# Patient Record
Sex: Female | Born: 1985 | Race: White | Hispanic: No | Smoking: Former smoker
Health system: Southern US, Community
[De-identification: ages and names within clinical notes are randomized; demographics above are authoritative.]

## PROBLEM LIST (undated history)

## (undated) VITALS — BP 100/67 | HR 69 | Resp 18

## (undated) DIAGNOSIS — N200 Calculus of kidney: Secondary | ICD-10-CM

## (undated) DIAGNOSIS — S2220XA Unspecified fracture of sternum, initial encounter for closed fracture: Secondary | ICD-10-CM

## (undated) DIAGNOSIS — D4959 Neoplasm of unspecified behavior of other genitourinary organ: Secondary | ICD-10-CM

## (undated) DIAGNOSIS — D260 Other benign neoplasm of cervix uteri: Secondary | ICD-10-CM

## (undated) DIAGNOSIS — J449 Chronic obstructive pulmonary disease, unspecified: Secondary | ICD-10-CM

## (undated) DIAGNOSIS — I471 Supraventricular tachycardia, unspecified: Secondary | ICD-10-CM

## (undated) DIAGNOSIS — G43909 Migraine, unspecified, not intractable, without status migrainosus: Secondary | ICD-10-CM

## (undated) DIAGNOSIS — J45909 Unspecified asthma, uncomplicated: Secondary | ICD-10-CM

## (undated) HISTORY — PX: ABDOMINAL HYSTERECTOMY: SHX81

## (undated) HISTORY — PX: APPENDECTOMY: SHX54

## (undated) HISTORY — PX: OTHER SURGICAL HISTORY: SHX169

## (undated) HISTORY — PX: MINOR HEMORRHOIDECTOMY: SHX6238

## (undated) HISTORY — PX: TUMOR REMOVAL: SHX12

## (undated) HISTORY — PX: BLADDER REPAIR: SHX76

---

## 2011-10-30 ENCOUNTER — Ambulatory Visit: Payer: Self-pay | Admitting: Internal Medicine

## 2012-01-17 ENCOUNTER — Emergency Department: Payer: Self-pay | Admitting: Emergency Medicine

## 2012-03-22 ENCOUNTER — Emergency Department: Payer: Self-pay | Admitting: Emergency Medicine

## 2012-03-23 ENCOUNTER — Emergency Department: Payer: Self-pay | Admitting: Emergency Medicine

## 2012-05-10 ENCOUNTER — Ambulatory Visit: Payer: Self-pay | Admitting: Surgery

## 2012-05-13 LAB — PATHOLOGY REPORT

## 2012-06-09 ENCOUNTER — Other Ambulatory Visit: Payer: Self-pay | Admitting: Surgery

## 2012-06-16 ENCOUNTER — Emergency Department: Payer: Self-pay | Admitting: Emergency Medicine

## 2012-06-16 LAB — URINALYSIS, COMPLETE
Glucose,UR: NEGATIVE mg/dL (ref 0–75)
Ketone: NEGATIVE
Nitrite: POSITIVE
Ph: 6 (ref 4.5–8.0)
RBC,UR: 596 /HPF (ref 0–5)
Specific Gravity: 1.009 (ref 1.003–1.030)
Squamous Epithelial: 2
WBC UR: 235 /HPF (ref 0–5)

## 2012-06-16 LAB — BASIC METABOLIC PANEL
Anion Gap: 4 — ABNORMAL LOW (ref 7–16)
BUN: 11 mg/dL (ref 7–18)
Calcium, Total: 9.5 mg/dL (ref 8.5–10.1)
Chloride: 106 mmol/L (ref 98–107)
Co2: 28 mmol/L (ref 21–32)
EGFR (African American): >60
Osmolality: 274 (ref 275–301)
Potassium: 3.9 mmol/L (ref 3.5–5.1)
Sodium: 138 mmol/L (ref 136–145)

## 2012-06-16 LAB — CBC
HCT: 43.6 % (ref 35.0–47.0)
MCHC: 32.1 g/dL (ref 32.0–36.0)
MCV: 95 fL (ref 80–100)
WBC: 5.9 10*3/uL (ref 3.6–11.0)

## 2012-06-16 LAB — HEPATIC FUNCTION PANEL A (ARMC)
Albumin: 4.5 g/dL (ref 3.4–5.0)
Alkaline Phosphatase: 61 U/L (ref 50–136)
Bilirubin,Total: 0.5 mg/dL (ref 0.2–1.0)
SGPT (ALT): 21 U/L (ref 12–78)
Total Protein: 8.6 g/dL — ABNORMAL HIGH (ref 6.4–8.2)

## 2012-06-16 LAB — WET PREP, GENITAL

## 2012-06-17 LAB — CBC
HCT: 40.6 % (ref 35.0–47.0)
HGB: 13.8 g/dL (ref 12.0–16.0)
MCV: 94 fL (ref 80–100)
Platelet: 241 10*3/uL (ref 150–440)
RDW: 12.8 % (ref 11.5–14.5)

## 2012-06-17 LAB — COMPREHENSIVE METABOLIC PANEL
Albumin: 4.1 g/dL (ref 3.4–5.0)
Anion Gap: 7 (ref 7–16)
Chloride: 110 mmol/L — ABNORMAL HIGH (ref 98–107)
Creatinine: 0.65 mg/dL (ref 0.60–1.30)
EGFR (African American): >60
EGFR (Non-African Amer.): >60
Glucose: 81 mg/dL (ref 65–99)
Osmolality: 281 (ref 275–301)
SGPT (ALT): 20 U/L (ref 12–78)
Sodium: 142 mmol/L (ref 136–145)
Total Protein: 7.8 g/dL (ref 6.4–8.2)

## 2012-06-18 ENCOUNTER — Inpatient Hospital Stay: Payer: Self-pay | Admitting: Surgery

## 2012-06-18 LAB — CBC WITH DIFFERENTIAL/PLATELET
Basophil #: 0 10*3/uL (ref 0.0–0.1)
Basophil %: 0.8 %
Eosinophil #: 0.1 10*3/uL (ref 0.0–0.7)
Eosinophil %: 2.4 %
HCT: 36.6 % (ref 35.0–47.0)
Lymphocyte %: 37.6 %
Neutrophil #: 2.2 10*3/uL (ref 1.4–6.5)
Platelet: 225 10*3/uL (ref 150–440)
RBC: 3.85 10*6/uL (ref 3.80–5.20)
WBC: 4.4 10*3/uL (ref 3.6–11.0)

## 2012-06-21 LAB — PATHOLOGY REPORT

## 2013-01-20 ENCOUNTER — Emergency Department: Payer: Self-pay | Admitting: Emergency Medicine

## 2013-03-09 ENCOUNTER — Ambulatory Visit: Payer: Self-pay | Admitting: Obstetrics and Gynecology

## 2013-03-17 ENCOUNTER — Ambulatory Visit: Payer: Self-pay | Admitting: Specialist

## 2013-04-06 ENCOUNTER — Emergency Department: Payer: Self-pay | Admitting: Emergency Medicine

## 2013-04-20 ENCOUNTER — Ambulatory Visit: Payer: Self-pay | Admitting: Obstetrics and Gynecology

## 2013-04-20 LAB — BASIC METABOLIC PANEL
Anion Gap: 6 — ABNORMAL LOW (ref 7–16)
Calcium, Total: 8.8 mg/dL (ref 8.5–10.1)
Chloride: 108 mmol/L — ABNORMAL HIGH (ref 98–107)
EGFR (African American): >60
EGFR (Non-African Amer.): >60
Glucose: 79 mg/dL (ref 65–99)
Osmolality: 275 (ref 275–301)
Potassium: 3.9 mmol/L (ref 3.5–5.1)
Sodium: 139 mmol/L (ref 136–145)

## 2013-04-20 LAB — CBC
HCT: 38.8 % (ref 35.0–47.0)
MCHC: 33.9 g/dL (ref 32.0–36.0)
MCV: 94 fL (ref 80–100)
Platelet: 252 10*3/uL (ref 150–440)
RBC: 4.11 10*6/uL (ref 3.80–5.20)

## 2013-04-20 LAB — PREGNANCY, URINE: Pregnancy Test, Urine: NEGATIVE m[IU]/mL

## 2013-04-24 ENCOUNTER — Inpatient Hospital Stay: Payer: Self-pay | Admitting: Obstetrics and Gynecology

## 2013-04-25 LAB — CREATININE, SERUM
Creatinine: 0.62 mg/dL (ref 0.60–1.30)
EGFR (African American): >60

## 2013-04-25 LAB — HEMOGLOBIN: HGB: 8.8 g/dL — ABNORMAL LOW (ref 12.0–16.0)

## 2013-04-26 LAB — BASIC METABOLIC PANEL
Anion Gap: 3 — ABNORMAL LOW (ref 7–16)
BUN: 5 mg/dL — ABNORMAL LOW (ref 7–18)
Chloride: 108 mmol/L — ABNORMAL HIGH (ref 98–107)
Co2: 29 mmol/L (ref 21–32)
Creatinine: 0.66 mg/dL (ref 0.60–1.30)
EGFR (Non-African Amer.): >60
Glucose: 118 mg/dL — ABNORMAL HIGH (ref 65–99)
Osmolality: 278 (ref 275–301)
Sodium: 140 mmol/L (ref 136–145)

## 2013-04-26 LAB — CBC WITH DIFFERENTIAL/PLATELET
Basophil %: 0.3 %
Eosinophil #: 0 10*3/uL (ref 0.0–0.7)
Eosinophil %: 0.1 %
HCT: 21.9 % — ABNORMAL LOW (ref 35.0–47.0)
HGB: 7.6 g/dL — ABNORMAL LOW (ref 12.0–16.0)
Lymphocyte #: 0.7 10*3/uL — ABNORMAL LOW (ref 1.0–3.6)
Lymphocyte %: 9.2 %
MCH: 32.7 pg (ref 26.0–34.0)
Platelet: 150 10*3/uL (ref 150–440)

## 2013-04-26 LAB — PATHOLOGY REPORT

## 2013-05-08 ENCOUNTER — Ambulatory Visit: Payer: Self-pay | Admitting: Urology

## 2013-05-09 ENCOUNTER — Other Ambulatory Visit: Payer: Self-pay | Admitting: Obstetrics and Gynecology

## 2013-05-09 LAB — CBC WITH DIFFERENTIAL/PLATELET
Basophil #: 0.1 10*3/uL (ref 0.0–0.1)
Basophil %: 0.9 %
Eosinophil #: 0.2 10*3/uL (ref 0.0–0.7)
HCT: 33.5 % — ABNORMAL LOW (ref 35.0–47.0)
HGB: 11.3 g/dL — ABNORMAL LOW (ref 12.0–16.0)
Lymphocyte %: 16.8 %
MCV: 92 fL (ref 80–100)
Monocyte %: 5.3 %
Neutrophil %: 74.4 %

## 2013-06-14 ENCOUNTER — Ambulatory Visit: Payer: Self-pay | Admitting: Urology

## 2013-09-25 ENCOUNTER — Ambulatory Visit: Payer: Self-pay | Admitting: Specialist

## 2013-09-28 LAB — PATHOLOGY REPORT

## 2014-02-10 ENCOUNTER — Ambulatory Visit: Payer: Self-pay | Admitting: Family Medicine

## 2014-02-10 LAB — COMPREHENSIVE METABOLIC PANEL
ALK PHOS: 48 U/L
ALT: 18 U/L
AST: 14 U/L — AB (ref 15–37)
Albumin: 3.8 g/dL (ref 3.4–5.0)
Anion Gap: 9 (ref 7–16)
BILIRUBIN TOTAL: 0.5 mg/dL (ref 0.2–1.0)
BUN: 11 mg/dL (ref 7–18)
CALCIUM: 9.2 mg/dL (ref 8.5–10.1)
CO2: 24 mmol/L (ref 21–32)
Chloride: 105 mmol/L (ref 98–107)
Creatinine: 0.73 mg/dL (ref 0.60–1.30)
EGFR (Non-African Amer.): >60
GLUCOSE: 91 mg/dL (ref 65–99)
OSMOLALITY: 275 (ref 275–301)
POTASSIUM: 3.5 mmol/L (ref 3.5–5.1)
Sodium: 138 mmol/L (ref 136–145)
TOTAL PROTEIN: 7.4 g/dL (ref 6.4–8.2)

## 2014-02-10 LAB — URINALYSIS, COMPLETE
BILIRUBIN, UR: NEGATIVE
Bacteria: NEGATIVE
Blood: NEGATIVE
GLUCOSE, UR: NEGATIVE
KETONE: NEGATIVE
Leukocyte Esterase: NEGATIVE
NITRITE: NEGATIVE
PH: 5.5 (ref 5.0–8.0)
Protein: NEGATIVE
RBC,UR: NONE SEEN /HPF (ref 0–5)
Specific Gravity: 1.025 (ref 1.000–1.030)
WBC UR: NONE SEEN /HPF (ref 0–5)

## 2014-02-10 LAB — CBC WITH DIFFERENTIAL/PLATELET
Basophil #: 0 10*3/uL (ref 0.0–0.1)
Basophil %: 0.5 %
EOS ABS: 0.1 10*3/uL (ref 0.0–0.7)
EOS PCT: 1.4 %
HCT: 41 % (ref 35.0–47.0)
HGB: 13.9 g/dL (ref 12.0–16.0)
Lymphocyte #: 1 10*3/uL (ref 1.0–3.6)
Lymphocyte %: 25.9 %
MCH: 31.4 pg (ref 26.0–34.0)
MCHC: 33.9 g/dL (ref 32.0–36.0)
MCV: 93 fL (ref 80–100)
MONO ABS: 0.4 x10 3/mm (ref 0.2–0.9)
MONOS PCT: 10.8 %
Neutrophil #: 2.5 10*3/uL (ref 1.4–6.5)
Neutrophil %: 61.4 %
Platelet: 253 10*3/uL (ref 150–440)
RBC: 4.43 10*6/uL (ref 3.80–5.20)
RDW: 12.4 % (ref 11.5–14.5)
WBC: 4 10*3/uL (ref 3.6–11.0)

## 2014-02-12 LAB — URINE CULTURE

## 2014-02-15 ENCOUNTER — Ambulatory Visit: Payer: Self-pay | Admitting: Family Medicine

## 2014-02-22 ENCOUNTER — Ambulatory Visit: Payer: Self-pay | Admitting: Neurology

## 2014-03-08 ENCOUNTER — Other Ambulatory Visit: Payer: Self-pay | Admitting: Specialist

## 2014-03-08 ENCOUNTER — Ambulatory Visit: Payer: Self-pay | Admitting: Specialist

## 2014-03-08 LAB — D-DIMER(ARMC): D-DIMER: 891 ng/mL

## 2014-08-01 ENCOUNTER — Emergency Department: Payer: Self-pay | Admitting: Emergency Medicine

## 2014-09-25 NOTE — Consult Note (Signed)
PATIENT NAME:  Maria Silva, Maria Silva MR#:  [id number] DATE OF BIRTH:  1985/11/14  DATE OF CONSULTATION:  03/23/2012  REFERRING PHYSICIAN:   CONSULTING PHYSICIAN:  Harrell Gave A. Inaara Tye, MD  CHIEF COMPLAINT: Rectal pain times two weeks.   HISTORY OF PRESENT ILLNESS: Ms. Maria Silva is a pleasant 29 year old female with history of hemorrhoids that she likely developed after multiple children and which required prior incision for thrombosis. She says that her most recent episode began over the last two weeks. She says that she has an area that is tender. Normally she after having them lysed gets better. However, she had her thrombosed hemorrhoid lysed yesterday and they did not improve. She said that initially she felt better, but the pain recurred. She has been having normal bowel movement, does not strain with  bowel movements. Otherwise no fevers, chills, night sweats, shortness of breath, headaches, chest pain, abdominal pain, nausea, vomiting, diarrhea, constipation, hematuria, or dysuria.   PAST MEDICAL HISTORY:  1. Hemorrhoids.  2. History of C-section.  3. History of tubal ligation.  4. History of supraventricular tachycardia.   CURRENT MEDICATIONS: 1. Percocet 1 tab p.o. q. 6 h. p.r.n. pain. 2. Colace 100, 1 tab p.o. b.i.d.   ALLERGIES: Oxacillin.   FAMILY HISTORY: Noncontributory.   PHYSICAL EXAMINATION:  VITAL SIGNS: Temperature 98.1, pulse 48, blood pressure 114/74, respirations 20, 99 percent on room air.   GENERAL: No acute distress, alert and oriented times three.   HEAD: Normocephalic, atraumatic.   EYES: No jaundice. No scleral icterus.  NECK: No obvious masses.   RESPIRATORY:  Moving air well. No splinting.   HEART: Regular rate and rhythm.   ABDOMEN: Soft, nontender, nondistended.   RECTUM: Has what looks like a right posterior column external hemorrhoid which is thrombotic. It appears to have been lysed but still is very thrombotic. I have thus with local  anesthetic attempted to re-lance this and have opened the incision significantly but have not been able to express any clot.   EXTREMITIES: Moves all extremities well.   LABORATORY DATA:  No current labs.   ASSESSMENT AND PLAN: Ms. Maria Silva is a pleasant 29 year old female who had a previous lysis of thrombosed hemorrhoid.  I have attempted to see if there is any more clot there for expression and have opened it significantly but there is not much clot. I thus recommend stool softeners, sitz bath, lidocaine topically, and  pain control.  She will follow up with me in the office in two days.   Procedure Note:  After Maria Silva received oral pain medication and I had consented her for incision of her thrombosed hemorrohid, I prepped and draped her anus.  I then infiltrated her hemorrhoid with approx 4 ml of 1% lidocaine with epinephrine.I then incised her hemorrohoid.  There was minimal clot and bleeding.  I then placed sterile gauze over her hemorrhoid after I was ensured it was hemostatic.  There were no immediate complications.  ____________________________ Glena Norfolk Iziah Cates, MD cal:bjt D: 03/23/2012 15:35:47 ET T: 03/23/2012 16:30:05 ET JOB#: [id number]  cc: Harrell Gave A. Jesse Hirst, MD, <Dictator> Floyde Parkins MD ELECTRONICALLY SIGNED 03/27/2012 9:50

## 2014-09-25 NOTE — Op Note (Signed)
PATIENT NAME:  Maria Silva, Maria Silva MR#:  [id number] DATE OF BIRTH:  28-Mar-1986  DATE OF PROCEDURE:  05/10/2012  PREOPERATIVE DIAGNOSIS: Engorged, recurrently thrombosed external hemorrhoids.   POSTOPERATIVE DIAGNOSIS: Engorged, recurrently thrombosed external hemorrhoids.   PROCEDURES PERFORMED: 1. Rectal exam under anesthesia. 2. Closed excisional external hemorrhoidectomy x 2 columns.   SURGEON: Emilie Carp A. Valery Chance, MD   ANESTHESIA: General.   ESTIMATED BLOOD LOSS: 15 mL.   COMPLICATIONS: None.   SPECIMENS: Hemorrhoids.   INDICATION FOR SURGERY: Ms. Maria Silva is a pleasant 29 year old female who has had recurrent thrombosed external hemorrhoids which began after her multiple pregnancies. I first met her approximately two months ago with thrombosed external hemorrhoids. Those have gotten better but she still feels the sensation of having hemorrhoids and says this is very uncomfortable. She presented for surgical hemorrhoidectomy.   DETAILS OF PROCEDURE: Informed consent was obtained as an outpatient. She was brought to the operating room suite. She was induced, endotracheal tube was placed, and general anesthesia was administered. She was positioned in the prone jackknife position. Her buttocks were taped apart and Betadine was used to prep her anus. Sterile drapes were then placed in the operative field. I began by doing a digital exam and did not feel any masses. On visual inspection I did find two large engorged areas of external hemorrhoids which were protruding. I injected 40 mL of 1% lidocaine with epinephrine into her perianal skin and proximal to her sphincter muscles for pain control and to allow relaxation. Using a Hill-Ferguson retractor circumferentially, I found that the hemorrhoids appeared to be associated with the left lateral columns and the right posterior columns. She had a slight left lateral internal hemorrhoid. Using an Allis clamp I slighted the left lateral hemorrhoid  and placed hemostatic 3-0 chromic stitch at the apex. I then used a Harmonic to excise the hemorrhoid and then close the mucosal defect using the same running 3-0 chromic suture grabbing a piece of mucosa and underlying muscle to get rid of the dead space and then mucosa on the other side. I locked these in a running fashion. I then focused on the right posterior column. Again, I placed an 0 chromic suture to ligate the pedicle at the apex of the hemorrhoid and excised the hemorrhoid using a Harmonic scalpel. After ensuring it was hemostatic, I then closed the defect using the same running 3-0 chromic again taking a bite of the underlying muscle to allow the dead space to close the hematoma quicker. I then irrigated the area and it was hemostatic. I placed a fibrillar plug into her anus which she will expel with her next bowel movement. She was placed back on the transport bed, extubated and brought to the postanesthesia care unit. There were no immediate complications.   She was then discharged home in satisfactory condition to follow with me in approximately two weeks barring any complications.   ____________________________ Glena Norfolk Yizel Canby, MD cal:drc D: 05/10/2012 09:02:45 ET T: 05/10/2012 11:17:10 ET JOB#: [id number]  cc: Harrell Gave A. Adrienne Trombetta, MD, <Dictator> Floyde Parkins MD ELECTRONICALLY SIGNED 05/11/2012 20:40

## 2014-09-28 NOTE — Consult Note (Signed)
Pt with moderate discomfortUOP, slight blood tinged.good.answered from patient.post op care except maintain foley at least 7 days.  Electronic Signatures: Murrell Redden (MD)  (Signed on [phone] 09:18)  Authored  Last Updated: [id number]-RXY-[id number] 09:18 by Murrell Redden (MD)

## 2014-09-28 NOTE — Op Note (Signed)
PATIENT NAME:  Maria Silva, Maria Silva MR#:  [id number] DATE OF BIRTH:  04/14/1986  DATE OF PROCEDURE:  04/24/2013  PREOPERATIVE DIAGNOSIS: Gross hematuria.   POSTOPERATIVE DIAGNOSIS: Bladder laceration.   PROCEDURES:  1. Cystoscopy.  2. Closure of cystotomy.   SURGEON: Scott C. Bernardo Heater, M.D.   ASSISTANT: Dr. Enzo Bi.   ANESTHESIA: General.   INDICATION: A 29 year old female with chronic pelvic pain and endometriosis. She underwent a laparoscopic-assisted vaginal hysterectomy and bilateral salpingo-oophorectomy earlier today. Urine was clear in the operating room; however, she was noted in the postanesthetic care unit to have gross hematuria. She is taken back to the operating room for cystoscopy and possible exploratory laparotomy.   DESCRIPTION OF PROCEDURE: The patient was taken to the operating room where general anesthetic was administered. She was then placed in the low lithotomy position, and her external genitalia were prepped and draped in the usual fashion. A 21-French cystoscope sheath with obturator was lubricated and passed per urethra. A moderate amount of bloody urine with a few small clots returned. The 30-degree lens was then placed in the sheath. The ureteral orifices were normal appearing with clear efflux. On the left posterior wall, a bladder laceration was noted, estimated approximately 2 cm. Although the laceration was well away from the ureteral orifice, it was elected to place a 6-French open-ended ureteral catheter which was placed over a wire. A 22-French Foley was placed. The patient was then reprepped and draped. Laparotomy was performed by Dr. Enzo Bi which is dictated separately. After adequate exposure, approximately 2.5 cm posteroinferior wall bladder laceration was noted. The superior and inferior edges were exposed. The laceration was repaired with a running, interlocking 2-0 Vicryl suture. The bladder was instilled with 120 mL of saline with minimal leakage  noted. A second layer was performed with a running 2-0 Vicryl suture. Repeat instillation with saline showed no leakage. Foley catheter was kept to gravity drainage. Closure dictated separately by Dr. Enzo Bi. There were no complications.   ____________________________ [name], MD scs:gb D: 04/24/2013 21:34:53 ET T: 04/24/2013 22:11:48 ET JOB#: [id number]  cc: Nicki Reaper C. Bernardo Heater, MD, <Dictator> Abbie Sons MD ELECTRONICALLY SIGNED 05/14/2013 17:10

## 2014-09-28 NOTE — Op Note (Signed)
PATIENT NAME:  Maria Silva, Maria Silva MR#:  [id number] DATE OF BIRTH:  03/27/1986  DATE OF PROCEDURE:  04/24/2013  PREOPERATIVE DIAGNOSES:  1.  Chronic pelvic pain.  2.  Endometriosis.  3.  Family history of breast and ovarian cancer.   OPERATIVE PROCEDURE: Laparoscopically assisted vaginal hysterectomy, bilateral salpingo-oophorectomy.   SURGEON: Alanda Slim. Kyllie Pettijohn, MD  FIRST ASSISTANT: Dr. Marcelline Mates and Ferne Coe (?) PA-S.   ANESTHESIA: General endotracheal.   INDICATIONS: The patient is a 29 year old white female who has diagnosis of endometriosis who presents for definitive surgery due to chronic pelvic pain. Previously an endometrioma was excised from the right adnexa by general surgery during an appendectomy procedure.   FINDINGS AT SURGERY: A few white lesions of endometriosis noted in the cul-de-sac. There was a right ovarian cyst, simple. The tube on the left showed evidence of tubal ligation. The uterus was grossly normal.   DESCRIPTION OF PROCEDURE: The patient was brought to the operating room where she was placed in the supine position. General endotracheal anesthesia was induced without difficulty. She was placed in the dorsal lithotomy position using the Bumblebee stirrups. A ChloraPrep and Betadine abdominal, perineal, intravaginal prep and drape was performed in standard fashion. Foley catheter was placed and was draining clear yellow urine from the bladder.   The laparoscopic portion of the procedure was performed in routine fashion. Subumbilical vertical incision 5 mm in length was made. The Optiview laparoscopic trocar system was used to place a 5 mm port directly into the abdominopelvic cavity without evidence of bowel or vascular injury. The right and left lower quadrant ports were then placed under direct visualization. These were also 5 mm ports.   Using the Ace Harmonic scalpel, the round ligaments were clamped, coagulated and cut. The infundibulopelvic ligaments were  again isolated and clamped, coagulated and cut using the Harmonic scalpel. Care was taken to avoid the ureters. The cardinal-broad ligament complexes were taken down, again with the Harmonic scalpel bilaterally. Bladder flap was created using the Harmonic scalpel as well.   Once the bladder was mobilized, the vaginal portion of the procedure was then started. The patient was placed in the dorsal lithotomy position with legs being placed higher. The weighted speculum was placed into the vagina and a double-tooth tenaculum was placed onto the cervix. A posterior colpotomy was made with Mayo scissors. The uterosacral ligaments were clamped, cut, and stick-tied using 0 Vicryl suture. The posterior cuff was run using a running locking stitch of 0 chromic suture. A long weighted speculum was placed then back into the vagina.   The cervix was circumscribed using the Bovie cautery. The bladder was dissected off the lower uterine segment through sharp and blunt dissection. Sequentially the cardinal-broad ligament complexes were then clamped, cut, and stick-tied using 0 Vicryl suture. This was carried out until the specimen was completely mobilized and removed from the operative field. A moderate amount of bleeding was encountered and laparoscopy was then repeated to identify the source of bleeding. Once laparoscopy was implemented, there may have been what appeared to be a little bit of pedicle bleeding from the left IP ligament. Kleppinger bipolar cautery was used to further attain hemostasis. All pedicles appeared hemostatic and the decision was made then to proceed with vaginal closure. The vagina was then closed using simple interrupted sutures of 2-0 chromic suture. Upon completion of the procedure, the patient was then awakened, extubated and taken to the recovery room in satisfactory condition.   ESTIMATED BLOOD LOSS: 500 mL.  IV FLUIDS: 2 liters.   URINE OUTPUT: Not quantified at the time of this  dictation.    ____________________________ Alanda Slim Shenae Bonanno, MD mad:np D: 04/24/2013 19:33:19 ET T: 04/24/2013 19:59:32 ET JOB#: [id number]  cc: Hassell Done A. Fendi Meinhardt, MD, <Dictator> [facility] Alanda Slim Mckinleigh Schuchart MD ELECTRONICALLY SIGNED 04/25/2013 22:05

## 2014-09-28 NOTE — Discharge Summary (Signed)
PATIENT NAME:  Maria Silva, Maria Silva MR#:  [id number] DATE OF BIRTH:  March 04, 1986  DATE OF ADMISSION:  06/17/2012 DATE OF DISCHARGE:  06/21/2012  BRIEF HISTORY AND HOSPITAL COURSE: Maria Silva is a 29 year old woman seen in the Emergency Room with significant abdominal pain, referred by her primary care provider, Melody Burr. She had had significant abdominal pain for several days. She presented to the Emergency Room for further evaluation 24 hours prior to admission and she was noted to have a slightly enlarged appendix. She continued to have significant abdominal discomfort. She was referred to her gynecology service who recommended she return to the Emergency Room for further evaluation. Work-up in the Emergency Room did not reveal any evidence of significant abdominal symptoms suggestive of appendicitis. She did have normal white blood cell count at 5900 the night before and 3700 the day of admission. Clinical examination did not support appendicitis. We admitted her to the hospital, placed her on IV medications and pain medications and fluid rehydration. She continued to have significant abdominal discomfort not controlled by narcotics. On January 11th she was taken to surgery where she underwent diagnostic laparoscopy. The appendix was normal, but was removed. She appeared to have evidence of endometriosis in the pelvis and several pictures were taken to help confirm the finding. Pathology did demonstrate an endometrioma from her right tube and ovary. She improved over the next 48 hours on pain medication and rehydration slowly advancing her diet. She was then discharged home on the 14th to be followed in the gynecologist's office for further investigation and work-up and possible treatment for her endometriosis.   DISCHARGE MEDICATIONS: 1. Ventolin 90 mcg 2 puffs 4 times a day.  2. Alaway 0.025% ophthalmic solution once a day. 3. Percocet 5/325 every 6 hours p.r.n.  4. Xanax 0.25 mg p.o. q. 8 hours  p.r.n.  5. Morphine 30 mg extended-release 1 tablet every 8 hours. 6. Morphine 15 mg immediate-release every 3 hours p.r.n.  NOTE: She was to stop taking her Excedrin. No antibiotics were prescribed.   FINAL DISCHARGE DIAGNOSES: 1. Abdominal pain.  2. Endometriosis.   SURGERY: 1. Diagnostic laparoscopy.  2. Appendectomy.  3. Excision of endometrioma.  ____________________________ [name], MD rle:sb D: 06/27/2012 20:04:32 ET T: 06/28/2012 08:46:17 ET JOB#: [id number]  cc: Rodena Goldmann III, MD, <Dictator> Melody Valene Bors, CNM Rodena Goldmann MD ELECTRONICALLY SIGNED 06/28/2012 22:49

## 2014-09-28 NOTE — Op Note (Signed)
PATIENT NAME:  Maria Silva, Maria Silva DATE OF BIRTH:  May 24, 1986  DATE OF PROCEDURE:  06/18/2012  PREOPERATIVE DIAGNOSIS: Abdominal pain.  POSTOPERATIVE DIAGNOSIS: Abdominal pain. Possible endometriosis and endometrioma.   SURGERY: Laparoscopy and incidental appendectomy.   SURGEON: Rodena Goldmann, M.D.   ANESTHESIA: General.   OPERATIVE PROCEDURE: With the patient in the supine position after the induction of appropriate general anesthesia, the patient's abdomen was prepped with ChloraPrep and draped with sterile towels. The patient was placed in the head-down, feet-up position. A small infraumbilical incision was made in the standard fashion, carried down bluntly through the subcutaneous tissue. The Veress needle was used to cannulate the peritoneal cavity. CO2 was insufflated to the appropriate pressure measurements. When approximately 2.5 liters of CO2 were instilled, the Veress needle was withdrawn. An 11 mm [employer] port was inserted into the peritoneal cavity. Intraperitoneal position was confirmed. CO2 was re-insufflated. A right upper quadrant transverse incision made and an 11 mm port inserted under direct vision. The right lower quadrant was investigated. The appendix appeared to be normal without evidence of inflammatory change, injection or rupture. There was a large, normal-appearing right ovary with several small cystic structures on the surface. There was a circular hemorrhagic structure attached to the tube, which appeared to be a possible endometrioma.  It was removed.  It was approximately 1 cm to 1.5 cm in size. Pictures were taken. The specimen was sent for permanent section. The left ovary was examined. There appeared to be some endometriosis on the tube.  There were some cystic structures on the surface in addition, but they were quite small.  Uterus was examined and no significant abnormalities were identified.  The  abdomen was visually inspected.  There was no obvious  abnormality.  The camera was moved to the upper port and the umbilicus re-examined.  There did not appear to be any evidence of vascular or bowel injury on entering the abdomen.  The appendix was then removed using GI stapling device, dividing the base of the appendix with a single blue load and the mesoappendix was with 2 applications of the GI white load.  The appendix was removed with an Endo Catch apparatus.  The area was copiously suctioned and irrigated.  The abdomen was desufflated.  Midline fascia was closed with figure-of-eight sutures of 0 Vicryl.  The skin was closed with 5-0 nylon.  The area was infiltrated with 0.25% Marcaine for postoperative pain control.  Sterile dressings were applied.  The patient was returned to the recovery room having tolerated the procedure well. Sponge, instrument and needle counts were correct x 2 in the operating room.     ____________________________ [name], MD rle:cc D: 06/18/2012 16:48:00 ET T: 06/19/2012 19:21:04 ET JOB#: [id number]  cc: Rodena Goldmann III, MD, <Dictator>  Rodena Goldmann MD ELECTRONICALLY SIGNED 06/24/2012 8:48

## 2014-09-28 NOTE — Op Note (Signed)
PATIENT NAME:  Maria Silva, Maria Silva MR#:  [id number] DATE OF BIRTH:  07-08-1985  DATE OF PROCEDURE:  04/24/2013  PREOPERATIVE DIAGNOSES:  1.  Status post laparoscopic-assisted vaginal hysterectomy, bilateral salpingo-oophorectomy.  2.  Hematuria.   POSTOPERATIVE DIAGNOSES:  1.  Status post laparoscopic-assisted vaginal hysterectomy, bilateral salpingo-oophorectomy.  2.  Hematuria.  3.  Bladder lacerations.   OPERATIVE PROCEDURES:  1.  Cystoscopy (Dr. John Giovanni).  2.  Exploratory laparotomy with lateral laceration repair.   ANESTHESIA: General endotracheal.   SURGEONS: Brayton Mars, M.D. and Dr. John Giovanni.  ASSISTANT: Rubie Maid, MD, and Hazle Coca, PA-S.   INDICATIONS: The patient is a 29 year old white female who just completed an LAVH-BSO procedure for symptomatic endometriosis, who was noted in the recovery room to have gross hematuria. Because of concern for possible bladder injury, the patient was counseled and consented to have cystoscopy evaluation with possible bladder repair surgery.   FINDINGS AT SURGERY: Included a 2 cm posterior left bladder dome laceration. There were several arterial bleeders noted in the region of the laceration. In addition, several surgical pedicles were notable for some mild oozing. These areas had to be reinforced with several  sutures.   DESCRIPTION OF PROCEDURE: The patient was brought to the operating room where she was placed in the supine position. General endotracheal anesthesia was induced without difficulty. She was placed in the dorsal lithotomy position and prepped and draped for cystoscopy by Dr. Bernardo Heater. Please see his note for details regarding the cystoscopy procedure.  Following verification that there was a bladder laceration noted in the Left dome of the the bladder, the patient was repositioned and reprepped for exploratory laparotomy for repair of the bladder laceration. A ChloraPrep abdominal prep was performed. A  Foley catheter was placed into the bladder. Pfannenstiel skin incision was made. The fascia was incised transversely and extended bilaterally with Mayo scissors. The rectus muscle was dissected off the fascia through sharp and blunt dissection. The midline raphe was identified and  separated and the peritoneum was entered.   A Balfour retractor was placed into the abdominal cavity to help facilitate exposure. The bowel was packed off with wet laps. Inspection of the pelvis was notable for some diffuse oozing from the vascular pedicles in the right infundibulopelvic ligament region. There also were two areas of arterial bleeding noted close to the area of the bladder laceration. These were also suture ligated with 3-0 Vicryl suture. The right infundibulopelvic ligament was also suture ligated using 2-0 Vicryl stitch.   The inspection of the bladder laceration lesion was then performed by Dr. Bernardo Heater. The repair then was undertaken by Dr. Bernardo Heater and please see his note for details.   The repair was performed in 2 layers and liquid integrity was confirmed with instilling 120 mL of saline into the bladder without evidence of leakage. Once satisfied with the bladder repair and hemostasis within the pelvis, the abdomen was closed in standard fashion. The fascia was closed with 0 Maxon in a simple running manner. The skin was closed with staples. Pressure dressing was applied. The patient was then awakened, mobilized, and taken to the recovery room in satisfactory condition.   Estimated blood loss for the procedure was 300 mL. IV fluids were 1000 mL. Urine output was 250 mL. All instruments, needle and sponge counts were verified as correct.    ____________________________ [name], MD mad:np D: 04/25/2013 21:42:42 ET T: 04/25/2013 22:22:00 ET JOB#: [id number]  cc: Hassell Done A. Oshua Mcconaha, MD, <Dictator> [facility] Alanda Slim Demonte Dobratz MD ELECTRONICALLY SIGNED 05/11/2013 7:38

## 2014-09-28 NOTE — Consult Note (Signed)
Feeling better, bladder spasms improved vss, max temp 99.6 Exam: Urine clear  Impression: Status post cystotomy closure Recommendation: Foley catheter drainage 7-10 days.  We will schedule CT cystogram prior to follow-up/catheter removal.   Electronic Signatures: Abbie Sons (MD)  (Signed on 20-Nov-14 07:49)  Authored  Last Updated: 20-Nov-14 07:49 by Abbie Sons (MD)

## 2014-09-28 NOTE — H&P (Signed)
PATIENT NAME:  Maria Silva, Maria Silva MR#:  [id number] DATE OF BIRTH:  30-May-1986  DATE OF ADMISSION:  06/17/2012  PRIMARY CARE PROVIDER:  Evonnie Pat, CNM  ADMITTING PHYSICIAN:  Dr. Pat Patrick.   CHIEF COMPLAINT:  Abdominal pain, nausea, and vomiting.   BRIEF HISTORY:  The patient is a 29 year old woman seen in the Emergency Room with abdominal pain for 3 to 4 days. She has had episodes of right lower quadrant pain in the past which she feels are different than the episode that she is experiencing currently. She has been told that she has possible endometriosis, possible ovarian problems on that right side, but has not had any recent symptoms until approximately 3 or 4 days ago. She began to develop some pronounced right lower quadrant pain which was constant without radiation. It worsened over the last 24 hours and is associated with profound nausea and vomiting. She presented to the Emergency Room last night for further evaluation. Laboratory values were unremarkable. CT scan was performed which revealed a slightly dilated appendix at 1 mm larger than the upper limits of normal with an air filled appendix. No evidence of any inflammatory changes. The surgical service was consulted. She was advised to follow up with her primary care gynecologist today.   She saw Melody Trudee Kuster today who recommended with her right lower quadrant pain and persistent symptoms and CT findings that she return to the Emergency Room for further evaluation. Again, white blood cell count was unremarkable. The surgical service was consulted.   She has no history of previous major GI issues including hepatitis, yellow jaundice, pancreatitis, peptic ulcer disease, gallbladder disease, or diverticulitis. Only previous surgery has been 2 C-sections. She was going to undergo some biopsies with her GYN examination for possible endometriosis, but those were postponed because she has menstrual flow at the present time. She denies any fever, but  has had a chill. Her nausea has been profound and she has vomited several times.   She denies a history of cardiac disease, hypertension, diabetes, or thyroid disease. She does have a history of asthma on p.r.n. therapy. She works from home. She is a reformed cigarette smoker. Drinks alcohol only occasionally.   MEDICATIONS AT THE PRESENT TIME:  [place] ophthalmic solution 2 drops, Cipro 500 mg b.i.d., doxycycline 100 mg b.i.d., Excedrin 2 tablets every 6 hours p.r.n., Percocet 5/325 mg q. 6 hours p.r.n., Valium 5 mg p.o. q. 8 hours p.r.n., Ventolin 90 mcg 2 puffs q. 4 hours p.r.n.   ALLERGIES:  CODEINE AND AMOXICILLIN.   REVIEW OF SYSTEMS: Otherwise unremarkable.   PHYSICAL EXAMINATION: GENERAL: She is crying, upset, anxious, clearly distraught in moderate distress from abdominal pain.  VITAL SIGNS: She is afebrile at 98.2, pulse is 82 and regular, blood pressure is 109/64.  HEENT: Examination is unremarkable with no scleral icterus. No pupillary abnormalities. No facial deformities.  NECK: Supple without adenopathy. Trachea is midline.  CHEST: She has no chest excursion problems, no adventitious sounds. Normal breath sounds.  CARDIAC: No murmurs or gallops. She seems to be in normal sinus rhythm.  ABDOMEN: Soft, nondistended with suprapubic and right lower quadrant point tenderness. She has no rebound, but she has guarding. No masses are noted. No hernias are seen.  EXTREMITIES: Lower extremity reveals full range of motion. No deformities. Good distal pulses.  PSYCHIATRIC: Normal orientation, normal affect, although she is clearly upset and almost adversarial in discussion regarding her current situation.   IMPRESSION:  I have independently reviewed her CT  scan. She does have an air filled appendix that does not appear to be significantly dilated or inflamed. Clinical presentation is not consistent with acute appendicitis. I suspect that there are symptoms related to her ovary, endometriosis or  GYN problems on the right side. The family and patient quite upset as they were told she was having an appendectomy. I told her we would be admitting her to the hospital for further evaluation and the next test if she continues to have symptoms that were poorly controlled would be to consider a diagnostic laparoscopy. The family is upset, but appeared to understand the plan. We will admit her to the hospital and work on following her laboratory values, maintaining her antibiotics and try to control her pain.    ____________________________ [name], MD rle:si D: 06/17/2012 16:58:00 ET T: 06/17/2012 17:51:20 ET JOB#: [id number]  cc: [name], MD, <Dictator> Melody Valene Bors, CNM  Rodena Goldmann MD ELECTRONICALLY SIGNED 06/18/2012 17:44

## 2014-09-29 NOTE — Op Note (Signed)
PATIENT NAME:  Maria Silva, Maria Silva MR#:  [id number] DATE OF BIRTH:  June 03, 1986  DATE OF PROCEDURE:  09/25/2013  PREOPERATIVE DIAGNOSIS: Deep dorsal ganglion, right wrist.    POSTOPERATIVE DIAGNOSIS: Deep dorsal ganglion, right wrist.  PROCEDURE: Excision of deep dorsal ganglion, right wrist.   SURGEON: Earnestine Leys, M.D.   ANESTHESIA: General.   COMPLICATIONS: None.   DRAINS: None.   ESTIMATED BLOOD LOSS: None.   REPLACED: None.   DESCRIPTION OF PROCEDURE: The patient was brought to the operating room where she underwent satisfactory general LMA anesthesia in the supine position. The right arm was prepped and draped in sterile fashion. To palpation, the patient had the deep dorsal ganglion at the radiocarpal region, which was fairly sessile in nature. A transverse incision was made over the ganglion and dissection carried out bluntly through subcutaneous tissue. Spring retractors were inserted and the fascia was carefully debrided away from the cyst. Ganglion was identified and was tracked down to its base at the radiocarpal region. It was amputated there and sent as a specimen. The rongeur was used to remove remaining tissue there. Opening was a fairly small opening, but then was partially closed with 5-0 Vicryl suture. The wound was irrigated. Subcutaneous tissue was closed with 5-0 Vicryl and skin was closed with 5-0 nylon. Marcaine 0.5% was placed in the wound and a dry sterile compression hand dressing and volar splint was applied. The tourniquet was deflated with good return of blood flow to the hand. Prior to initiating surgery, the arm was exsanguinated and a pneumatic tourniquet was inflated to 250 mmHg. Tourniquet time was 21 minutes. After releasing the tourniquet, the patient was awakened and taken to recovery in good condition.   ____________________________ Park Breed, MD hem:aw D: 09/25/2013 13:36:34 ET T: 09/25/2013 14:07:44 ET JOB#: [id number]  cc: Park Breed,  MD, <Dictator> Park Breed MD ELECTRONICALLY SIGNED 09/25/2013 14:38

## 2014-10-04 ENCOUNTER — Emergency Department
Admit: 2014-10-04 | Disposition: A | Discharge status: Home / Self Care | Payer: Self-pay | Admitting: Emergency Medicine

## 2014-10-05 ENCOUNTER — Emergency Department
Admit: 2014-10-05 | Disposition: A | Discharge status: Home / Self Care | Payer: Self-pay | Admitting: Emergency Medicine

## 2014-10-05 LAB — CBC
HCT: 41.1 % (ref 35.0–47.0)
HGB: 14.2 g/dL (ref 12.0–16.0)
MCH: 31.9 pg (ref 26.0–34.0)
MCHC: 34.5 g/dL (ref 32.0–36.0)
MCV: 92 fL (ref 80–100)
Platelet: 268 10*3/uL (ref 150–440)
RBC: 4.45 10*6/uL (ref 3.80–5.20)
RDW: 12.8 % (ref 11.5–14.5)
WBC: 6.4 10*3/uL (ref 3.6–11.0)

## 2014-10-05 LAB — COMPREHENSIVE METABOLIC PANEL
Albumin: 4 g/dL
Alkaline Phosphatase: 43 U/L
Anion Gap: 7 (ref 7–16)
BILIRUBIN TOTAL: 0.4 mg/dL
BUN: 12 mg/dL
CO2: 19 mmol/L — AB
CREATININE: 0.78 mg/dL
Calcium, Total: 9 mg/dL
Chloride: 116 mmol/L — ABNORMAL HIGH
EGFR (African American): >60
GLUCOSE: 88 mg/dL
Potassium: 3.3 mmol/L — ABNORMAL LOW
SGOT(AST): 15 U/L
SGPT (ALT): 13 U/L — ABNORMAL LOW
Sodium: 142 mmol/L
TOTAL PROTEIN: 7.3 g/dL

## 2014-10-06 ENCOUNTER — Emergency Department
Admit: 2014-10-06 | Discharge: 2014-10-07 | Disposition: A | Discharge status: Home / Self Care | Payer: 59 | Source: Ambulatory Visit | Attending: Emergency Medicine | Admitting: Emergency Medicine

## 2014-10-06 ENCOUNTER — Other Ambulatory Visit: Payer: Self-pay

## 2014-10-06 DIAGNOSIS — Z880 Allergy status to penicillin: Secondary | ICD-10-CM | POA: Diagnosis not present

## 2014-10-06 DIAGNOSIS — F419 Anxiety disorder, unspecified: Secondary | ICD-10-CM | POA: Insufficient documentation

## 2014-10-06 DIAGNOSIS — M549 Dorsalgia, unspecified: Secondary | ICD-10-CM | POA: Diagnosis not present

## 2014-10-06 DIAGNOSIS — J441 Chronic obstructive pulmonary disease with (acute) exacerbation: Secondary | ICD-10-CM | POA: Insufficient documentation

## 2014-10-06 DIAGNOSIS — R0789 Other chest pain: Secondary | ICD-10-CM | POA: Diagnosis not present

## 2014-10-06 DIAGNOSIS — R1013 Epigastric pain: Secondary | ICD-10-CM

## 2014-10-06 DIAGNOSIS — R112 Nausea with vomiting, unspecified: Secondary | ICD-10-CM | POA: Diagnosis not present

## 2014-10-06 DIAGNOSIS — Z79899 Other long term (current) drug therapy: Secondary | ICD-10-CM | POA: Diagnosis not present

## 2014-10-06 DIAGNOSIS — Z87891 Personal history of nicotine dependence: Secondary | ICD-10-CM | POA: Insufficient documentation

## 2014-10-06 DIAGNOSIS — R109 Unspecified abdominal pain: Secondary | ICD-10-CM | POA: Diagnosis present

## 2014-10-06 DIAGNOSIS — R000 Tachycardia, unspecified: Secondary | ICD-10-CM | POA: Diagnosis not present

## 2014-10-06 LAB — CBC
HCT: 42.1 % (ref 35.0–47.0)
HGB: 14.4 g/dL (ref 12.0–16.0)
MCH: 32.2 pg (ref 26.0–34.0)
MCHC: 34.3 g/dL (ref 32.0–36.0)
MCV: 94 fL (ref 80–100)
PLATELETS: 263 10*3/uL (ref 150–440)
RBC: 4.48 10*6/uL (ref 3.80–5.20)
RDW: 12.9 % (ref 11.5–14.5)
WBC: 5.3 10*3/uL (ref 3.6–11.0)

## 2014-10-07 ENCOUNTER — Ambulatory Visit
Admission: RE | Admit: 2014-10-07 | Discharge: 2014-10-07 | Disposition: A | Discharge status: Home / Self Care | Payer: 59 | Source: Ambulatory Visit | Attending: Emergency Medicine | Admitting: Emergency Medicine

## 2014-10-07 ENCOUNTER — Other Ambulatory Visit: Payer: Self-pay | Admitting: Emergency Medicine

## 2014-10-07 DIAGNOSIS — W19XXXA Unspecified fall, initial encounter: Secondary | ICD-10-CM

## 2014-10-07 HISTORY — DX: Unspecified asthma, uncomplicated: J45.909

## 2014-10-07 LAB — COMPREHENSIVE METABOLIC PANEL
ANION GAP: 8 (ref 7–16)
Albumin: 4.2 g/dL
Alkaline Phosphatase: 44 U/L
BUN: 14 mg/dL
Bilirubin,Total: 0.5 mg/dL
CALCIUM: 9.5 mg/dL
CREATININE: 0.66 mg/dL
Chloride: 111 mmol/L
Co2: 21 mmol/L — ABNORMAL LOW
EGFR (Non-African Amer.): >60
Glucose: 94 mg/dL
Potassium: 3.2 mmol/L — ABNORMAL LOW
SGOT(AST): 15 U/L
SGPT (ALT): 13 U/L — ABNORMAL LOW
SODIUM: 140 mmol/L
Total Protein: 7.8 g/dL

## 2014-10-07 LAB — LIPASE, BLOOD: Lipase: 40 U/L

## 2014-10-07 LAB — PROTIME-INR
INR: 1
Prothrombin Time: 13.4 secs

## 2014-10-07 MED ORDER — OXYCODONE-ACETAMINOPHEN 5-325 MG PO TABS
ORAL_TABLET | ORAL | Status: AC
  Filled 2014-10-07: qty 1

## 2014-10-07 MED ORDER — DIAZEPAM 2 MG PO TABS
2.0000 mg | ORAL_TABLET | Freq: Once | ORAL | Status: AC
  Administered 2014-10-07: 2 mg via ORAL

## 2014-10-07 MED ORDER — IBUPROFEN 600 MG PO TABS: 600.0000 mg | ORAL_TABLET | Freq: Three times a day (TID) | ORAL | Status: DC | PRN

## 2014-10-07 MED ORDER — IOHEXOL 350 MG/ML SOLN
100.0000 mL | Freq: Once | INTRAVENOUS | Status: AC | PRN
  Administered 2014-10-07: 100 mL via INTRAVENOUS

## 2014-10-07 MED ORDER — OXYCODONE-ACETAMINOPHEN 5-325 MG PO TABS: 1.0000 | ORAL_TABLET | ORAL | Status: DC | PRN

## 2014-10-07 MED ORDER — IBUPROFEN 600 MG PO TABS
ORAL_TABLET | ORAL | Status: AC
  Filled 2014-10-07: qty 1

## 2014-10-07 MED ORDER — DIAZEPAM 2 MG PO TABS: 2.0000 mg | ORAL_TABLET | Freq: Three times a day (TID) | ORAL | Status: DC | PRN

## 2014-10-07 MED ORDER — OXYCODONE-ACETAMINOPHEN 5-325 MG PO TABS
1.0000 | ORAL_TABLET | Freq: Once | ORAL | Status: AC
  Administered 2014-10-07: 1 via ORAL

## 2014-10-07 MED ORDER — DIAZEPAM 2 MG PO TABS
ORAL_TABLET | ORAL | Status: AC
  Filled 2014-10-07: qty 1

## 2014-10-07 MED ORDER — IBUPROFEN 600 MG PO TABS
600.0000 mg | ORAL_TABLET | Freq: Once | ORAL | Status: AC
  Administered 2014-10-07: 600 mg via ORAL

## 2014-10-07 NOTE — ED Provider Notes (Signed)
See T-sheet for details of history and physical exam.  Paulette Blanch, MD 10/07/14 [phone]

## 2014-10-07 NOTE — ED Notes (Signed)
Gave pt. Instructions on how to use incentive spirometer

## 2014-10-07 NOTE — Discharge Instructions (Signed)
1. TAKE MEDICINES AS NEEDED FOR PAIN & MUSCLE SPASMS (MOTRIN/PERCOCET/VALIUM). 2. USE INCENTIVE SPIROMETER AS DIRECTED. 3. RETURN TO THE ER FOR WORSENING SYMPTOMS, PERSISTENT VOMITING, DIFFICULTY BREATHING OR OTHER CONCERNS.  Abdominal Pain Many things can cause belly (abdominal) pain. Most times, the belly pain is not dangerous. Many cases of belly pain can be watched and treated at home. HOME CARE   Do not take medicines that help you go poop (laxatives) unless told to by your doctor.  Only take medicine as told by your doctor.  Eat or drink as told by your doctor. Your doctor will tell you if you should be on a special diet. GET HELP IF:  You do not know what is causing your belly pain.  You have belly pain while you are sick to your stomach (nauseous) or have runny poop (diarrhea).  You have pain while you pee or poop.  Your belly pain wakes you up at night.  You have belly pain that gets worse or better when you eat.  You have belly pain that gets worse when you eat fatty foods.  You have a fever. GET HELP RIGHT AWAY IF:   The pain does not go away within 2 hours.  You keep throwing up (vomiting).  The pain changes and is only in the right or left part of the belly.  You have bloody or tarry looking poop. MAKE SURE YOU:   Understand these instructions.  Will watch your condition.  Will get help right away if you are not doing well or get worse. Document Released: 11/11/2007 Document Revised: 05/30/2013 Document Reviewed: 02/01/2013 [facility] Patient Information 2015 [place], [place]. This information is not intended to replace advice given to you by your health care provider. Make sure you discuss any questions you have with your health care provider.  Chest Wall Pain Chest wall pain is pain felt in or around the chest bones and muscles. It may take up to 6 weeks to get better. It may take longer if you are active. Chest wall pain can happen on its own. Other times,  things like germs, injury, coughing, or exercise can cause the pain. HOME CARE   Avoid activities that make you tired or cause pain. Try not to use your chest, belly (abdominal), or side muscles. Do not use heavy weights.  Put ice on the sore area.  Put ice in a plastic bag.  Place a towel between your skin and the bag.  Leave the ice on for 15-20 minutes for the first 2 days.  Only take medicine as told by your doctor. GET HELP RIGHT AWAY IF:   You have more pain or are very uncomfortable.  You have a fever.  Your chest pain gets worse.  You have new problems.  You feel sick to your stomach (nauseous) or throw up (vomit).  You start to sweat or feel lightheaded.  You have a cough with mucus (phlegm).  You cough up blood. MAKE SURE YOU:   Understand these instructions.  Will watch your condition.  Will get help right away if you are not doing well or get worse. Document Released: 11/11/2007 Document Revised: 08/17/2011 Document Reviewed: 01/19/2011 [facility] Patient Information 2015 [place], [place]. This information is not intended to replace advice given to you by your health care provider. Make sure you discuss any questions you have with your health care provider.

## 2014-10-09 ENCOUNTER — Emergency Department
Admission: EM | Admit: 2014-10-09 | Discharge: 2014-10-09 | Disposition: A | Discharge status: Home / Self Care | Payer: 59 | Attending: Emergency Medicine | Admitting: Emergency Medicine

## 2014-10-09 ENCOUNTER — Encounter: Payer: Self-pay | Admitting: Emergency Medicine

## 2014-10-09 ENCOUNTER — Emergency Department: Payer: 59

## 2014-10-09 DIAGNOSIS — M940 Chondrocostal junction syndrome [Tietze]: Secondary | ICD-10-CM | POA: Diagnosis not present

## 2014-10-09 DIAGNOSIS — R0789 Other chest pain: Secondary | ICD-10-CM

## 2014-10-09 DIAGNOSIS — Y998 Other external cause status: Secondary | ICD-10-CM | POA: Insufficient documentation

## 2014-10-09 DIAGNOSIS — M5416 Radiculopathy, lumbar region: Secondary | ICD-10-CM | POA: Diagnosis not present

## 2014-10-09 DIAGNOSIS — S0993XA Unspecified injury of face, initial encounter: Secondary | ICD-10-CM | POA: Diagnosis not present

## 2014-10-09 DIAGNOSIS — S0990XA Unspecified injury of head, initial encounter: Secondary | ICD-10-CM | POA: Insufficient documentation

## 2014-10-09 DIAGNOSIS — Z79899 Other long term (current) drug therapy: Secondary | ICD-10-CM | POA: Diagnosis not present

## 2014-10-09 DIAGNOSIS — Z880 Allergy status to penicillin: Secondary | ICD-10-CM | POA: Diagnosis not present

## 2014-10-09 DIAGNOSIS — Y9389 Activity, other specified: Secondary | ICD-10-CM | POA: Diagnosis not present

## 2014-10-09 DIAGNOSIS — Y92410 Unspecified street and highway as the place of occurrence of the external cause: Secondary | ICD-10-CM | POA: Diagnosis not present

## 2014-10-09 DIAGNOSIS — R071 Chest pain on breathing: Secondary | ICD-10-CM

## 2014-10-09 DIAGNOSIS — M5417 Radiculopathy, lumbosacral region: Secondary | ICD-10-CM

## 2014-10-09 DIAGNOSIS — J45901 Unspecified asthma with (acute) exacerbation: Secondary | ICD-10-CM | POA: Insufficient documentation

## 2014-10-09 DIAGNOSIS — S299XXA Unspecified injury of thorax, initial encounter: Secondary | ICD-10-CM | POA: Diagnosis present

## 2014-10-09 DIAGNOSIS — Z87828 Personal history of other (healed) physical injury and trauma: Secondary | ICD-10-CM | POA: Insufficient documentation

## 2014-10-09 DIAGNOSIS — S79912A Unspecified injury of left hip, initial encounter: Secondary | ICD-10-CM | POA: Diagnosis not present

## 2014-10-09 HISTORY — DX: Unspecified fracture of sternum, initial encounter for closed fracture: S22.20XA

## 2014-10-09 MED ORDER — KETOROLAC TROMETHAMINE 60 MG/2ML IM SOLN
60.0000 mg | Freq: Once | INTRAMUSCULAR | Status: AC
  Administered 2014-10-09: 60 mg via INTRAMUSCULAR

## 2014-10-09 MED ORDER — ORPHENADRINE CITRATE 30 MG/ML IJ SOLN
INTRAMUSCULAR | Status: AC
  Administered 2014-10-09: 60 mg via INTRAMUSCULAR
  Filled 2014-10-09: qty 2

## 2014-10-09 MED ORDER — ALBUTEROL SULFATE HFA 108 (90 BASE) MCG/ACT IN AERS: 2.0000 | INHALATION_SPRAY | Freq: Four times a day (QID) | RESPIRATORY_TRACT | Status: DC | PRN

## 2014-10-09 MED ORDER — KETOROLAC TROMETHAMINE 60 MG/2ML IM SOLN
INTRAMUSCULAR | Status: AC
  Administered 2014-10-09: 60 mg via INTRAMUSCULAR
  Filled 2014-10-09: qty 2

## 2014-10-09 MED ORDER — ORPHENADRINE CITRATE 30 MG/ML IJ SOLN
60.0000 mg | INTRAMUSCULAR | Status: AC
  Administered 2014-10-09: 60 mg via INTRAMUSCULAR

## 2014-10-09 MED ORDER — CYCLOBENZAPRINE HCL 5 MG PO TABS: 5.0000 mg | ORAL_TABLET | Freq: Three times a day (TID) | ORAL | Status: DC | PRN

## 2014-10-09 MED ORDER — KETOROLAC TROMETHAMINE 10 MG PO TABS: 10.0000 mg | ORAL_TABLET | Freq: Three times a day (TID) | ORAL | Status: DC

## 2014-10-09 MED ORDER — IBUPROFEN 600 MG PO TABS: 600.0000 mg | ORAL_TABLET | Freq: Four times a day (QID) | ORAL | Status: DC | PRN

## 2014-10-09 NOTE — ED Notes (Signed)
Patient transported to X-ray 
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
## 2014-10-09 NOTE — Discharge Instructions (Signed)
Cervical Sprain °A cervical sprain is an injury in the neck in which the strong, fibrous tissues (ligaments) that connect your neck bones stretch or tear. Cervical sprains can range from mild to severe. Severe cervical sprains can cause the neck vertebrae to be unstable. This can lead to damage of the spinal cord and can result in serious nervous system problems. The amount of time it takes for a cervical sprain to get better depends on the cause and extent of the injury. Most cervical sprains heal in 1 to 3 weeks. °CAUSES  °Severe cervical sprains may be caused by:  °· Contact sport injuries (such as from football, rugby, wrestling, hockey, auto racing, gymnastics, diving, martial arts, or boxing).   °· Motor vehicle collisions.   °· Whiplash injuries. This is an injury from a sudden forward and backward whipping movement of the head and neck.  °· Falls.   °Mild cervical sprains may be caused by:  °· Being in an awkward position, such as while cradling a telephone between your ear and shoulder.   °· Sitting in a chair that does not offer proper support.   °· Working at a poorly designed computer station.   °· Looking up or down for long periods of time.   °SYMPTOMS  °· Pain, soreness, stiffness, or a burning sensation in the front, back, or sides of the neck. This discomfort may develop immediately after the injury or slowly, 24 hours or more after the injury.   °· Pain or tenderness directly in the middle of the back of the neck.   °· Shoulder or upper back pain.   °· Limited ability to move the neck.   °· Headache.   °· Dizziness.   °· Weakness, numbness, or tingling in the hands or arms.   °· Muscle spasms.   °· Difficulty swallowing or chewing.   °· Tenderness and swelling of the neck.   °DIAGNOSIS  °Most of the time your health care provider can diagnose a cervical sprain by taking your history and doing a physical exam. Your health care provider will ask about previous neck injuries and any known neck  problems, such as arthritis in the neck. X-rays may be taken to find out if there are any other problems, such as with the bones of the neck. Other tests, such as a CT scan or MRI, may also be needed.  °TREATMENT  °Treatment depends on the severity of the cervical sprain. Mild sprains can be treated with rest, keeping the neck in place (immobilization), and pain medicines. Severe cervical sprains are immediately immobilized. Further treatment is done to help with pain, muscle spasms, and other symptoms and may include: °· Medicines, such as pain relievers, numbing medicines, or muscle relaxants.   °· Physical therapy. This may involve stretching exercises, strengthening exercises, and posture training. Exercises and improved posture can help stabilize the neck, strengthen muscles, and help stop symptoms from returning.   °HOME CARE INSTRUCTIONS  °· Put ice on the injured area.   °¨ Put ice in a plastic bag.   °¨ Place a towel between your skin and the bag.   °¨ Leave the ice on for 15-20 minutes, 3-4 times a day.   °· If your injury was severe, you may have been given a cervical collar to wear. A cervical collar is a two-piece collar designed to keep your neck from moving while it heals. °¨ Do not remove the collar unless instructed by your health care provider. °¨ If you have long hair, keep it outside of the collar. °¨ Ask your health care provider before making any adjustments to your collar. Minor   adjustments may be required over time to improve comfort and reduce pressure on your chin or on the back of your head.  Ifyou are allowed to remove the collar for cleaning or bathing, follow your health care provider's instructions on how to do so safely.  Keep your collar clean by wiping it with mild soap and water and drying it completely. If the collar you have been given includes removable pads, remove them every 1-2 days and hand wash them with soap and water. Allow them to air dry. They should be completely  dry before you wear them in the collar.  If you are allowed to remove the collar for cleaning and bathing, wash and dry the skin of your neck. Check your skin for irritation or sores. If you see any, tell your health care provider.  Do not drive while wearing the collar.   Only take over-the-counter or prescription medicines for pain, discomfort, or fever as directed by your health care provider.   Keep all follow-up appointments as directed by your health care provider.   Keep all physical therapy appointments as directed by your health care provider.   Make any needed adjustments to your workstation to promote good posture.   Avoid positions and activities that make your symptoms worse.   Warm up and stretch before being active to help prevent problems.  SEEK MEDICAL CARE IF:   Your pain is not controlled with medicine.   You are unable to decrease your pain medicine over time as planned.   Your activity level is not improving as expected.  SEEK IMMEDIATE MEDICAL CARE IF:   You develop any bleeding.  You develop stomach upset.  You have signs of an allergic reaction to your medicine.   Your symptoms get worse.   You develop new, unexplained symptoms.   You have numbness, tingling, weakness, or paralysis in any part of your body.  MAKE SURE YOU:   Understand these instructions.  Will watch your condition.  Will get help right away if you are not doing well or get worse. Document Released: 03/22/2007 Document Revised: 05/30/2013 Document Reviewed: 11/30/2012 [facility] Patient Information 2015 [place], [place]. This information is not intended to replace advice given to you by your health care provider. Make sure you discuss any questions you have with your health care provider.  Using a heating pad 2-3 times daily as needed for comfort.

## 2014-10-09 NOTE — ED Notes (Signed)
In to pots room to given med's. Pt states that she is having increased SOB and that she is having increased CP. Provider notified
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
## 2014-10-11 ENCOUNTER — Emergency Department: Payer: 59

## 2014-10-11 ENCOUNTER — Emergency Department
Admission: EM | Admit: 2014-10-11 | Discharge: 2014-10-11 | Disposition: A | Discharge status: Home / Self Care | Payer: 59 | Attending: Emergency Medicine | Admitting: Emergency Medicine

## 2014-10-11 DIAGNOSIS — G8911 Acute pain due to trauma: Secondary | ICD-10-CM | POA: Diagnosis not present

## 2014-10-11 DIAGNOSIS — M25571 Pain in right ankle and joints of right foot: Secondary | ICD-10-CM | POA: Diagnosis not present

## 2014-10-11 DIAGNOSIS — R1013 Epigastric pain: Secondary | ICD-10-CM | POA: Diagnosis not present

## 2014-10-11 DIAGNOSIS — Z880 Allergy status to penicillin: Secondary | ICD-10-CM | POA: Diagnosis not present

## 2014-10-11 DIAGNOSIS — M25531 Pain in right wrist: Secondary | ICD-10-CM | POA: Insufficient documentation

## 2014-10-11 DIAGNOSIS — R071 Chest pain on breathing: Secondary | ICD-10-CM | POA: Insufficient documentation

## 2014-10-11 DIAGNOSIS — M25551 Pain in right hip: Secondary | ICD-10-CM | POA: Diagnosis not present

## 2014-10-11 DIAGNOSIS — M545 Low back pain: Secondary | ICD-10-CM | POA: Insufficient documentation

## 2014-10-11 DIAGNOSIS — W19XXXA Unspecified fall, initial encounter: Secondary | ICD-10-CM

## 2014-10-11 HISTORY — DX: Supraventricular tachycardia, unspecified: I47.10

## 2014-10-11 HISTORY — DX: Supraventricular tachycardia: I47.1

## 2014-10-11 HISTORY — DX: Chronic obstructive pulmonary disease, unspecified: J44.9

## 2014-10-11 LAB — COMPREHENSIVE METABOLIC PANEL
ALBUMIN: 4 g/dL (ref 3.5–5.0)
ALT: 25 U/L (ref 14–54)
ANION GAP: 6 (ref 5–15)
AST: 32 U/L (ref 15–41)
Alkaline Phosphatase: 44 U/L (ref 38–126)
BUN: 18 mg/dL (ref 6–20)
CO2: 21 mmol/L — ABNORMAL LOW (ref 22–32)
Calcium: 9.4 mg/dL (ref 8.9–10.3)
Chloride: 113 mmol/L — ABNORMAL HIGH (ref 101–111)
Creatinine, Ser: 0.7 mg/dL (ref 0.44–1.00)
GFR calc Af Amer: >60 mL/min (ref >60)
GFR calc non Af Amer: >60 mL/min (ref >60)
Glucose, Bld: 72 mg/dL (ref 65–99)
POTASSIUM: 4.1 mmol/L (ref 3.5–5.1)
SODIUM: 140 mmol/L (ref 135–145)
Total Bilirubin: 0.5 mg/dL (ref 0.3–1.2)
Total Protein: 7.4 g/dL (ref 6.5–8.1)

## 2014-10-11 LAB — URINALYSIS COMPLETE WITH MICROSCOPIC (ARMC ONLY)
Bacteria, UA: NONE SEEN
Bilirubin Urine: NEGATIVE
Glucose, UA: NEGATIVE mg/dL
Hgb urine dipstick: NEGATIVE
Ketones, ur: NEGATIVE mg/dL
Leukocytes, UA: NEGATIVE
Nitrite: NEGATIVE
PROTEIN: NEGATIVE mg/dL
RBC / HPF: NONE SEEN RBC/hpf (ref 0–5)
Specific Gravity, Urine: 1.012 (ref 1.005–1.030)
pH: 7 (ref 5.0–8.0)

## 2014-10-11 LAB — CBC WITH DIFFERENTIAL/PLATELET
Basophils Absolute: 0 10*3/uL (ref 0–0.1)
Basophils Relative: 1 %
EOS ABS: 0.1 10*3/uL (ref 0–0.7)
Eosinophils Relative: 3 %
HCT: 41.8 % (ref 35.0–47.0)
HEMOGLOBIN: 13.7 g/dL (ref 12.0–16.0)
LYMPHS ABS: 1.1 10*3/uL (ref 1.0–3.6)
LYMPHS PCT: 25 %
MCH: 30.9 pg (ref 26.0–34.0)
MCHC: 32.9 g/dL (ref 32.0–36.0)
MCV: 93.9 fL (ref 80.0–100.0)
MONOS PCT: 7 %
Monocytes Absolute: 0.3 10*3/uL (ref 0.2–0.9)
NEUTROS PCT: 64 %
Neutro Abs: 2.7 10*3/uL (ref 1.4–6.5)
Platelets: 241 10*3/uL (ref 150–440)
RBC: 4.45 MIL/uL (ref 3.80–5.20)
RDW: 12.8 % (ref 11.5–14.5)
WBC: 4.2 10*3/uL (ref 3.6–11.0)

## 2014-10-11 LAB — LIPASE, BLOOD: LIPASE: 32 U/L (ref 22–51)

## 2014-10-11 LAB — TROPONIN I

## 2014-10-11 LAB — CK: Total CK: 89 U/L (ref 38–234)

## 2014-10-11 MED ORDER — MORPHINE SULFATE 4 MG/ML IJ SOLN
INTRAMUSCULAR | Status: AC
  Administered 2014-10-11: 4 mg via INTRAVENOUS
  Filled 2014-10-11: qty 1

## 2014-10-11 MED ORDER — MORPHINE SULFATE 4 MG/ML IJ SOLN
4.0000 mg | Freq: Once | INTRAMUSCULAR | Status: AC
Start: 2014-10-11 — End: 2014-10-11
  Administered 2014-10-11: 4 mg via INTRAVENOUS

## 2014-10-11 MED ORDER — ONDANSETRON HCL 4 MG/2ML IJ SOLN
INTRAMUSCULAR | Status: AC
  Administered 2014-10-11: 4 mg via INTRAVENOUS
  Filled 2014-10-11: qty 2

## 2014-10-11 MED ORDER — LORAZEPAM 2 MG/ML IJ SOLN
INTRAMUSCULAR | Status: AC
  Administered 2014-10-11: 1 mg
  Filled 2014-10-11: qty 1

## 2014-10-11 MED ORDER — OXYCODONE-ACETAMINOPHEN 5-325 MG PO TABS: 1.0000 | ORAL_TABLET | ORAL | Status: DC | PRN

## 2014-10-11 MED ORDER — MORPHINE SULFATE 4 MG/ML IJ SOLN
4.0000 mg | Freq: Once | INTRAMUSCULAR | Status: AC
  Administered 2014-10-11: 4 mg via INTRAVENOUS

## 2014-10-11 MED ORDER — ONDANSETRON HCL 4 MG/2ML IJ SOLN
4.0000 mg | Freq: Once | INTRAMUSCULAR | Status: AC
  Administered 2014-10-11: 4 mg via INTRAVENOUS

## 2014-10-11 NOTE — ED Notes (Signed)
Patient transported to Ultrasound 

## 2014-10-11 NOTE — ED Notes (Signed)
Pt fell down 14 stairs on Sunday; seen in ER and dx with sternal fracture after fall. Pt then in [facility] Tuesday and was seen in ER; pt states that she is not happy with workup she got on Tuesday. Right hip pain, right foot pain, right arm pain, CP from sternal fracture. Pt arrives wearing own foot bandage and arm bandage.

## 2014-10-11 NOTE — ED Notes (Signed)
Pt reports "much relief" after splint applied. Pt questioned about splint to hip and foot due to pain. ED MD advised no splint available to help with pain in those areas. ED MD advised of important follow with interventions to help with soreness after injury.

## 2014-10-11 NOTE — ED Notes (Signed)
Pt placed on 2L [place] for comfort. Pt feels like she cannot breathe, pt has increase in RR due to crying.

## 2014-10-11 NOTE — Discharge Instructions (Signed)
Use ice or heat to the sore areas for 20 minutes an hour. Do not fall asleep[ on them you can get burns. Follow up with Dr Lisette Grinder the orthopedic doctor. He will need to check your thumb and possible rexray it in a week. Leave the splint on in the mean time. Do not put pressure on your thumb with the splint when you use the crutches. (you may have to use only one crutch.)  Use the percocet as needed.Do not take extra tylenol with it.

## 2014-10-11 NOTE — ED Notes (Signed)
Patient transported to X-ray 
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
## 2014-10-11 NOTE — ED Notes (Signed)
Pt back from x-ray.

## 2014-10-11 NOTE — ED Notes (Signed)
   10/11/14 2100  Reason for Medication/Intervention  Reason for Medication/Intervention Pain  Patients response to intervention Effective  Post-Intervention Pain Assessment  Pain Assessment 0-10  OTHER  Pain Score 5  Pt no longer crying. Pt reports decrease in pain after medication.

## 2014-10-11 NOTE — ED Notes (Signed)
Pt requesting pain medication. Pt crying and c/o continued pain. Pt states "the medicine they gave me here has not helped at all".  RN to advised ED MD.

## 2014-12-05 ENCOUNTER — Ambulatory Visit: Payer: Self-pay | Admitting: Surgery

## 2014-12-06 ENCOUNTER — Ambulatory Visit: Payer: Self-pay | Admitting: Surgery

## 2014-12-21 NOTE — Progress Notes (Signed)
Spoke with Dr. Rexene Edison regarding this patient. A letter of discharge from our practice will be sent to patient at this time for multiple NO SHOW and Cancellations with appointments. This has been sent to: [address] [place], [place] [address]. And has been sent by certified mail today.

## 2015-02-28 ENCOUNTER — Encounter: Payer: Self-pay | Admitting: *Deleted

## 2015-02-28 ENCOUNTER — Emergency Department
Admission: EM | Admit: 2015-02-28 | Discharge: 2015-02-28 | Disposition: A | Discharge status: Home / Self Care | Payer: 59 | Attending: Emergency Medicine | Admitting: Emergency Medicine

## 2015-02-28 DIAGNOSIS — S838X1A Sprain of other specified parts of right knee, initial encounter: Secondary | ICD-10-CM

## 2015-02-28 DIAGNOSIS — Y92410 Unspecified street and highway as the place of occurrence of the external cause: Secondary | ICD-10-CM | POA: Diagnosis not present

## 2015-02-28 DIAGNOSIS — Z880 Allergy status to penicillin: Secondary | ICD-10-CM | POA: Insufficient documentation

## 2015-02-28 DIAGNOSIS — Z79899 Other long term (current) drug therapy: Secondary | ICD-10-CM | POA: Diagnosis not present

## 2015-02-28 DIAGNOSIS — Y998 Other external cause status: Secondary | ICD-10-CM | POA: Insufficient documentation

## 2015-02-28 DIAGNOSIS — S83206A Unspecified tear of unspecified meniscus, current injury, right knee, initial encounter: Secondary | ICD-10-CM | POA: Insufficient documentation

## 2015-02-28 DIAGNOSIS — S8991XA Unspecified injury of right lower leg, initial encounter: Secondary | ICD-10-CM | POA: Diagnosis present

## 2015-02-28 DIAGNOSIS — Y9389 Activity, other specified: Secondary | ICD-10-CM | POA: Insufficient documentation
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
## 2015-02-28 NOTE — Discharge Instructions (Signed)
Knee, Cartilage (Meniscus) Injury    It is suspected that you have a torn cartilage (meniscus) in your knee. The menisci are made of tough cartilage and fit between the surfaces of the thigh and leg bones. The menisci are C-shaped and have a wedged profile. The wedged profile helps the stability of the joint by keeping the rounded femur surface from sliding off the flat tibial surface. The menisci are fed (nourished) by small blood vessels, but there is also a large area at the inner edge of the meniscus that does not have a good blood supply (avascular). This presents a problem when there is an injury to the meniscus because areas without good blood supply heal poorly. As a result when there is a torn cartilage in the knee, surgery is often required to fix it. This is usually done with a surgical procedure less invasive than open surgery (arthroscopy). Some times open surgery of the knee is required if there is other damage.  PURPOSE OF THE MENISCUS  The medial meniscus rests on the medial tibial plateau. The tibia is the large bone in your lower leg (the shin bone). The medial tibial plateau is the upper end of the bone making up the inner part of your knee. The lateral meniscus serves the same purpose and is located on the outside of the knee. The menisci help to distribute your body weight across the knee joint; they act as shock absorbers. Without the meniscus present, the weight of your body would be unevenly applied to the bones in your legs (the femur and tibia). The femur is the large bone in your thigh. This uneven weight distribution would cause increased wear and tear on the cartilage lining the joint surfaces, leading to early damage (arthritis) of these areas. The presence of the menisci cartilage is necessary for a healthy knee.  PURPOSE OF THE KNEE CARTILAGE  The knee joint is made up of three bones: the thigh bone (femur), the shin bone (tibia), and the knee cap (patella). The surfaces of  these bones at the knee joint are covered with cartilage called articular cartilage. This smooth, slippery surface allows the bones to slide against each other without causing bone damage. The meniscus sits between these cartilaginous surfaces of the bones. It distributes the weight evenly in the joints and helps with the stability of the joint (keeps the joint steady).  HOME CARE INSTRUCTIONS  Use crutches and external braces as instructed.  Once home, an ice pack applied to your injured knee may help with discomfort and keep the swelling down. An ice pack can be used for the first couple of days or as instructed.  Only take over-the-counter or prescription medicines for pain, discomfort, or fever as directed by your caregiver.  Call if you do not have relief of pain with medications or if there is increasing in pain.  Call if your foot becomes cold or blue.  You may resume normal diet and activities as directed.  Make sure to keep your appointments with your follow-up caregiver. This injury may require further evaluation and treatment beyond the temporary treatment given today. Document Released: 08/15/2002 Document Revised: 10/09/2013 Document Reviewed: 12/07/2008  [facility] Patient Information 2015 [place], [place]. This information is not intended to replace advice given to you by your health care provider. Make sure you discuss any questions you have with your health care provider.    Use your prescribed meloxicam for symptom relief until you can see Orthopedics for further evaluation and  treatment.

## 2015-02-28 NOTE — ED Notes (Signed)
PA at bedside for pt evaluation.
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
## 2015-03-12 ENCOUNTER — Other Ambulatory Visit: Payer: Self-pay | Admitting: Specialist

## 2015-03-12 DIAGNOSIS — S8391XD Sprain of unspecified site of right knee, subsequent encounter: Secondary | ICD-10-CM

## 2015-03-13 ENCOUNTER — Ambulatory Visit
Admission: RE | Admit: 2015-03-13 | Discharge: 2015-03-13 | Disposition: A | Discharge status: Home / Self Care | Payer: 59 | Source: Ambulatory Visit | Attending: Specialist | Admitting: Specialist

## 2015-03-13 DIAGNOSIS — X58XXXD Exposure to other specified factors, subsequent encounter: Secondary | ICD-10-CM | POA: Insufficient documentation

## 2015-03-13 DIAGNOSIS — S8391XD Sprain of unspecified site of right knee, subsequent encounter: Secondary | ICD-10-CM | POA: Insufficient documentation

## 2015-03-16 ENCOUNTER — Emergency Department: Payer: 59

## 2015-03-16 ENCOUNTER — Emergency Department
Admission: EM | Admit: 2015-03-16 | Discharge: 2015-03-16 | Disposition: A | Discharge status: Home / Self Care | Payer: 59 | Attending: Emergency Medicine | Admitting: Emergency Medicine

## 2015-03-16 ENCOUNTER — Encounter: Payer: Self-pay | Admitting: Emergency Medicine

## 2015-03-16 DIAGNOSIS — Z880 Allergy status to penicillin: Secondary | ICD-10-CM | POA: Insufficient documentation

## 2015-03-16 DIAGNOSIS — Z87891 Personal history of nicotine dependence: Secondary | ICD-10-CM | POA: Insufficient documentation

## 2015-03-16 DIAGNOSIS — Y9389 Activity, other specified: Secondary | ICD-10-CM | POA: Insufficient documentation

## 2015-03-16 DIAGNOSIS — S60222A Contusion of left hand, initial encounter: Secondary | ICD-10-CM

## 2015-03-16 DIAGNOSIS — Y9289 Other specified places as the place of occurrence of the external cause: Secondary | ICD-10-CM | POA: Insufficient documentation

## 2015-03-16 DIAGNOSIS — S60221A Contusion of right hand, initial encounter: Secondary | ICD-10-CM | POA: Insufficient documentation

## 2015-03-16 DIAGNOSIS — Z79899 Other long term (current) drug therapy: Secondary | ICD-10-CM | POA: Insufficient documentation

## 2015-03-16 DIAGNOSIS — Z7951 Long term (current) use of inhaled steroids: Secondary | ICD-10-CM | POA: Insufficient documentation

## 2015-03-16 DIAGNOSIS — Z791 Long term (current) use of non-steroidal anti-inflammatories (NSAID): Secondary | ICD-10-CM | POA: Insufficient documentation

## 2015-03-16 DIAGNOSIS — W231XXA Caught, crushed, jammed, or pinched between stationary objects, initial encounter: Secondary | ICD-10-CM | POA: Insufficient documentation

## 2015-03-16 DIAGNOSIS — Y998 Other external cause status: Secondary | ICD-10-CM | POA: Insufficient documentation

## 2015-03-16 HISTORY — DX: Neoplasm of unspecified behavior of other genitourinary organ: D49.59

## 2015-03-16 HISTORY — DX: Other benign neoplasm of cervix uteri: D26.0
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
## 2015-03-16 NOTE — Discharge Instructions (Signed)
Hand Contusion A hand contusion is a deep bruise on your hand area. Contusions are the result of an injury that caused bleeding under the skin. The contusion may turn blue, purple, or yellow. Minor injuries will give you a painless contusion, but more severe contusions may stay painful and swollen for a few weeks. CAUSES  A contusion is usually caused by a blow, trauma, or direct force to an area of the body. SYMPTOMS   Swelling and redness of the injured area.  Discoloration of the injured area.  Tenderness and soreness of the injured area.  Pain. DIAGNOSIS  The diagnosis can be made by taking a history and performing a physical exam. An X-ray, CT scan, or MRI may be needed to determine if there were any associated injuries, such as broken bones (fractures). TREATMENT  Often, the best treatment for a hand contusion is resting, elevating, icing, and applying cold compresses to the injured area. Over-the-counter medicines may also be recommended for pain control. HOME CARE INSTRUCTIONS   Put ice on the injured area.  Put ice in a plastic bag.  Place a towel between your skin and the bag.  Leave the ice on for 15-20 minutes, 03-04 times a day.  Only take over-the-counter or prescription medicines as directed by your caregiver. Your caregiver may recommend avoiding anti-inflammatory medicines (aspirin, ibuprofen, and naproxen) for 48 hours because these medicines may increase bruising.  If told, use an elastic wrap as directed. This can help reduce swelling. You may remove the wrap for sleeping, showering, and bathing. If your fingers become numb, cold, or blue, take the wrap off and reapply it more loosely.  Elevate your hand with pillows to reduce swelling.  Avoid overusing your hand if it is painful. SEEK IMMEDIATE MEDICAL CARE IF:   You have increased redness, swelling, or pain in your hand.  Your swelling or pain is not relieved with medicines.  You have loss of feeling in  your hand or are unable to move your fingers.  Your hand turns cold or blue.  You have pain when you move your fingers.  Your hand becomes warm to the touch.  Your contusion does not improve in 2 days. MAKE SURE YOU:   Understand these instructions.  Will watch your condition.  Will get help right away if you are not doing well or get worse.   This information is not intended to replace advice given to you by your health care provider. Make sure you discuss any questions you have with your health care provider.   Document Released: 11/14/2001 Document Revised: 02/17/2012 Document Reviewed: 11/16/2011 Elsevier Interactive Patient Education [employer].  Your exam and x-ray did not show a serious bony injury. Apply ice to reduce symptoms.  Follow-up with your provider as needed.
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
## 2015-03-16 NOTE — ED Notes (Signed)
Pt not in room when RN enters to d/c.  Pt left without d/c paperwork

## 2015-04-30 ENCOUNTER — Encounter: Payer: Self-pay | Admitting: Obstetrics and Gynecology

## 2015-07-01 ENCOUNTER — Emergency Department
Admission: EM | Admit: 2015-07-01 | Discharge: 2015-07-01 | Disposition: A | Discharge status: Home / Self Care | Payer: 59 | Attending: Emergency Medicine | Admitting: Emergency Medicine

## 2015-07-01 ENCOUNTER — Encounter: Payer: Self-pay | Admitting: Emergency Medicine

## 2015-07-01 ENCOUNTER — Emergency Department: Payer: 59

## 2015-07-01 DIAGNOSIS — Z79899 Other long term (current) drug therapy: Secondary | ICD-10-CM | POA: Insufficient documentation

## 2015-07-01 DIAGNOSIS — Z791 Long term (current) use of non-steroidal anti-inflammatories (NSAID): Secondary | ICD-10-CM | POA: Insufficient documentation

## 2015-07-01 DIAGNOSIS — S66307A Unspecified injury of extensor muscle, fascia and tendon of left little finger at wrist and hand level, initial encounter: Secondary | ICD-10-CM | POA: Insufficient documentation

## 2015-07-01 DIAGNOSIS — Z87891 Personal history of nicotine dependence: Secondary | ICD-10-CM | POA: Insufficient documentation

## 2015-07-01 DIAGNOSIS — Z7951 Long term (current) use of inhaled steroids: Secondary | ICD-10-CM | POA: Insufficient documentation

## 2015-07-01 DIAGNOSIS — S60052A Contusion of left little finger without damage to nail, initial encounter: Secondary | ICD-10-CM | POA: Insufficient documentation

## 2015-07-01 DIAGNOSIS — Y9289 Other specified places as the place of occurrence of the external cause: Secondary | ICD-10-CM | POA: Insufficient documentation

## 2015-07-01 DIAGNOSIS — S66902A Unspecified injury of unspecified muscle, fascia and tendon at wrist and hand level, left hand, initial encounter: Secondary | ICD-10-CM

## 2015-07-01 DIAGNOSIS — Z880 Allergy status to penicillin: Secondary | ICD-10-CM | POA: Insufficient documentation

## 2015-07-01 DIAGNOSIS — Y9389 Activity, other specified: Secondary | ICD-10-CM | POA: Insufficient documentation

## 2015-07-01 DIAGNOSIS — Y998 Other external cause status: Secondary | ICD-10-CM | POA: Insufficient documentation

## 2015-07-01 DIAGNOSIS — S6000XA Contusion of unspecified finger without damage to nail, initial encounter: Secondary | ICD-10-CM

## 2015-07-01 MED ORDER — ONDANSETRON 4 MG PO TBDP: 4.0000 mg | ORAL_TABLET | Freq: Three times a day (TID) | ORAL | Status: DC | PRN

## 2015-07-01 MED ORDER — OXYCODONE-ACETAMINOPHEN 5-325 MG PO TABS: 1.0000 | ORAL_TABLET | Freq: Four times a day (QID) | ORAL | Status: DC | PRN
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
## 2015-07-01 NOTE — Discharge Instructions (Signed)
Boxer's Knuckle  Boxer's knuckle is an injury to an extensor tendon. The extensor tendons are located on the back of the hand. They help the fingers to extend. They also help to protect the finger bones and joints. Boxer's knuckle develops if the layer of tissue that lies over these tendons becomes damaged and causes a tendon to move out of position. Boxer's knuckle often affects the first knuckle of the middle finger.  CAUSES  This condition is caused by direct or repeated injury (trauma) to a knuckle. If often happens during activities such boxing or martial arts.  RISK FACTORS  This condition is more likely to develop in:  People who participate in hitting or fighting sports, such as boxing and martial arts.  People who play contact sports, such as football and rugby.  People who have poor strength and flexibility.  People who have injured a knuckle. SYMPTOMS  Symptoms of this condition include:  Pain and swelling over the injured knuckle.  Difficulty straightening the affected finger.  Delay when you try to straighten the affected finger.  Tenderness when you touch the injured knuckle.  Abnormal movement of the affected tendon when you open and close your hand. DIAGNOSIS  This condition is diagnosed with a physical exam. Sometimes, X-rays are taken to check for additional problems, such as a fracture or cyst in the bone under the injured area.  TREATMENT  This condition may be treated with:  Ice applied to the affected area.  Medicines for pain.  Placing the hand in a cast or splint to keep the injured joint from moving while the tendon heals.  Surgery to repair the injured tendon or tissue. This may be done in severe cases. HOME CARE INSTRUCTIONS  If You Have a Cast:  Do not stick anything inside the cast to scratch your skin. Doing that increases your risk of infection.  Check the skin around the cast every day. Report any concerns to your health care provider. You may put lotion on  dry skin around the edges of the cast. Do not apply lotion to the skin underneath the cast.  Keep the cast clean and dry. If You Have a Splint:  Wear it as told by your health care provider. Remove it only as told by your health care provider.  Loosen the splint if your fingers become numb and tingle, or if they turn cold and blue.  Keep the splint clean and dry. Bathing  Do not take baths, swim, or use a hot tub until your health care provider approves. Ask your health care provider if you can take showers. You may only be allowed to take sponge baths for bathing.  If your health care provider approves bathing and showering, cover the cast or splint with a watertight plastic bag to protect it from water. Do not let the cast or splint get wet. Managing Pain, Stiffness, and Swelling  If directed, apply ice to the injured area.  Put ice in a plastic bag.  Place a towel between your skin and the bag.  Leave the ice on for 20 minutes, 2-3 times per day. Driving  Do not drive or operate heavy machinery while taking prescription pain medicine.  Ask your health care provider when it is safe to drive if you have a cast or splint on your hand. General Instructions  Do not put pressure on any part of the cast or splint until it is fully hardened. This may take several hours.  Do not  use any tobacco products, including cigarettes, chewing tobacco, or e-cigarettes. Tobacco can delay bone healing. If you need help quitting, ask your health care provider.  Take over-the-counter and prescription medicines only as told by your health care provider.  Keep all follow-up visits as told by your health care provider. This is important. SEEK MEDICAL CARE IF:  Your pain gets worse.  You hand tingles or feels numb.  Your hand becomes discolored. This information is not intended to replace advice given to you by your health care provider. Make sure you discuss any questions you have with your health care provider.    Document Released: 05/25/2005 Document Revised: 02/13/2015 Document Reviewed: 07/26/2014  Elsevier Interactive Patient Education [employer].
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
## 2015-10-02 ENCOUNTER — Telehealth: Payer: Self-pay | Admitting: *Deleted

## 2015-10-02 NOTE — Telephone Encounter (Signed)
Want me to work her in???

## 2015-10-02 NOTE — Telephone Encounter (Signed)
Patient called and needs to be seen sooner that her appt on 10/25/15. Pt is complaining of abd pain. Pt states that she " feels different" . Melody schedule is full. Please advise where to schedule. Thanks

## 2015-10-04 NOTE — Telephone Encounter (Signed)
Please see if one of other providers can see her sooner

## 2015-10-08 ENCOUNTER — Ambulatory Visit (INDEPENDENT_AMBULATORY_CARE_PROVIDER_SITE_OTHER): Payer: 59 | Admitting: Obstetrics and Gynecology

## 2015-10-08 ENCOUNTER — Encounter: Payer: Self-pay | Admitting: Obstetrics and Gynecology

## 2015-10-08 VITALS — BP 103/69 | HR 66 | Ht 64.0 in | Wt 139.0 lb

## 2015-10-08 DIAGNOSIS — Z87440 Personal history of urinary (tract) infections: Secondary | ICD-10-CM | POA: Diagnosis not present

## 2015-10-08 DIAGNOSIS — Z79890 Hormone replacement therapy (postmenopausal): Secondary | ICD-10-CM

## 2015-10-08 DIAGNOSIS — R102 Pelvic and perineal pain: Secondary | ICD-10-CM | POA: Diagnosis not present

## 2015-10-08 LAB — POCT URINALYSIS DIPSTICK
Bilirubin, UA: NEGATIVE
Blood, UA: NEGATIVE
Glucose, UA: NEGATIVE
KETONES UA: NEGATIVE
Leukocytes, UA: NEGATIVE
Nitrite, UA: NEGATIVE
PH UA: 6
SPEC GRAV UA: 1.02
Urobilinogen, UA: 0.2

## 2015-10-08 NOTE — Progress Notes (Signed)
Subjective:     Patient ID: Gwyneth Sprout, female   DOB: [birth year], 30 y.o.   MRN: [id number]  HPI Reports intermittent lower pelvic pains, first occurred one day a month, then increased to once a week and now daily- reports located in RLQ where she used to have ovarian cysts, and has  Right breast twinges at the same time, and now feels flu-like at the same time with vomiting. Denies any bowel or bladder changes. Does report pain with sex (not new). Also concerned about weight gain over the last few months  Review of Systems See above    Objective:   Physical Exam A&O x4  well groomed female in no distress Blood pressure 103/69, pulse 66, height 5\' 4"  (1.626 m), weight 139 lb (63.05 kg). Thyroid not enlarged Abdomen soft with slight tenderness in RLQ over scar. Pelvic exam: VULVA: normal appearing vulva with no masses, tenderness or lesions, no palpable internal organs.    Assessment:     RLQ Pain, unknown etiology HRT  Weight gain     Plan:     Labs obtained, discussed possible need to decrease estrogen dose as that may be culprit with symptoms.  Also discussed normallicy of weight gain after surgical menopause.   RTC as needed.  Eiko Mcgowen Cornish, CNM

## 2015-10-09 LAB — COMPREHENSIVE METABOLIC PANEL
A/G RATIO: 1.6 (ref 1.2–2.2)
ALT: 14 IU/L (ref 0–32)
AST: 16 IU/L (ref 0–40)
Albumin: 4.3 g/dL (ref 3.5–5.5)
Alkaline Phosphatase: 53 IU/L (ref 39–117)
BILIRUBIN TOTAL: 0.3 mg/dL (ref 0.0–1.2)
BUN/Creatinine Ratio: 19 (ref 9–23)
BUN: 15 mg/dL (ref 6–20)
CALCIUM: 9.2 mg/dL (ref 8.7–10.2)
CHLORIDE: 106 mmol/L (ref 96–106)
CO2: 18 mmol/L (ref 18–29)
Creatinine, Ser: 0.79 mg/dL (ref 0.57–1.00)
GFR calc Af Amer: 118 mL/min/{1.73_m2} (ref >59)
GFR, EST NON AFRICAN AMERICAN: 102 mL/min/{1.73_m2} (ref >59)
GLOBULIN, TOTAL: 2.7 g/dL (ref 1.5–4.5)
Glucose: 86 mg/dL (ref 65–99)
POTASSIUM: 4.3 mmol/L (ref 3.5–5.2)
SODIUM: 139 mmol/L (ref 134–144)
Total Protein: 7 g/dL (ref 6.0–8.5)

## 2015-10-09 LAB — THYROID PANEL WITH TSH
FREE THYROXINE INDEX: 2.6 (ref 1.2–4.9)
T3 UPTAKE RATIO: 26 % (ref 24–39)
T4 TOTAL: 10.1 ug/dL (ref 4.5–12.0)
TSH: 1.83 u[IU]/mL (ref 0.450–4.500)

## 2015-10-09 LAB — LIPID PANEL
CHOL/HDL RATIO: 3.5 ratio (ref 0.0–4.4)
Cholesterol, Total: 209 mg/dL — ABNORMAL HIGH (ref 100–199)
HDL: 59 mg/dL (ref >39)
LDL Calculated: 121 mg/dL — ABNORMAL HIGH (ref 0–99)
TRIGLYCERIDES: 144 mg/dL (ref 0–149)
VLDL Cholesterol Cal: 29 mg/dL (ref 5–40)

## 2015-10-09 LAB — ESTRADIOL: ESTRADIOL: 97.7 pg/mL

## 2015-10-09 LAB — PROGESTERONE: Progesterone: 0.5 ng/mL

## 2015-10-09 LAB — DHEA-SULFATE: DHEA SO4: 112.2 ug/dL (ref 84.8–378.0)

## 2015-10-10 ENCOUNTER — Telehealth: Payer: Self-pay | Admitting: Obstetrics and Gynecology

## 2015-10-10 NOTE — Telephone Encounter (Signed)
Patient is requesting lab results. Thanks 

## 2015-10-10 NOTE — Telephone Encounter (Signed)
Spoke with pt,advised MNS is out of the office until 10/15/15, she voiced understanding

## 2015-10-18 ENCOUNTER — Telehealth: Payer: Self-pay | Admitting: *Deleted

## 2015-10-18 NOTE — Telephone Encounter (Signed)
Called patient to discuss her upcoming appt. Patient was inquiring about the rest of her labs and also about what Melody wanted to do after her labs came back. Patient is requesting a call back . Call back number is [phone]

## 2015-10-18 NOTE — Telephone Encounter (Signed)
I spoke with pt about her labs,she was wondering if you was going to change any medication or what???

## 2015-10-25 ENCOUNTER — Encounter: Payer: Self-pay | Admitting: Obstetrics and Gynecology

## 2015-10-28 NOTE — Telephone Encounter (Signed)
I don't think she needs to change any dose at this time based on here labs

## 2015-11-08 NOTE — Telephone Encounter (Signed)
Left pt detailed message.

## 2015-11-08 NOTE — Telephone Encounter (Signed)
-----   Message from Joylene Igo, [place] sent at 10/15/2015  4:32 PM EDT ----- Please let her know all labs look good, including hormones, cholesterol was a little up, but normal if she wasn't fasting.

## 2015-11-08 NOTE — Telephone Encounter (Signed)
Notified pt of results 

## 2016-01-25 ENCOUNTER — Encounter: Payer: Self-pay | Admitting: Emergency Medicine

## 2016-01-25 ENCOUNTER — Emergency Department
Admission: EM | Admit: 2016-01-25 | Discharge: 2016-01-26 | Disposition: A | Discharge status: Home / Self Care | Payer: 59 | Attending: Emergency Medicine | Admitting: Emergency Medicine

## 2016-01-25 DIAGNOSIS — G43809 Other migraine, not intractable, without status migrainosus: Secondary | ICD-10-CM | POA: Diagnosis not present

## 2016-01-25 DIAGNOSIS — J45909 Unspecified asthma, uncomplicated: Secondary | ICD-10-CM | POA: Diagnosis not present

## 2016-01-25 DIAGNOSIS — R112 Nausea with vomiting, unspecified: Secondary | ICD-10-CM | POA: Diagnosis present

## 2016-01-25 DIAGNOSIS — Z87891 Personal history of nicotine dependence: Secondary | ICD-10-CM | POA: Insufficient documentation

## 2016-01-25 DIAGNOSIS — R109 Unspecified abdominal pain: Secondary | ICD-10-CM | POA: Diagnosis not present

## 2016-01-25 DIAGNOSIS — J449 Chronic obstructive pulmonary disease, unspecified: Secondary | ICD-10-CM | POA: Diagnosis not present

## 2016-01-25 DIAGNOSIS — Z79899 Other long term (current) drug therapy: Secondary | ICD-10-CM | POA: Diagnosis not present

## 2016-01-25 HISTORY — DX: Calculus of kidney: N20.0

## 2016-01-25 LAB — COMPREHENSIVE METABOLIC PANEL
ALT: 22 U/L (ref 14–54)
AST: 22 U/L (ref 15–41)
Albumin: 4.4 g/dL (ref 3.5–5.0)
Alkaline Phosphatase: 49 U/L (ref 38–126)
Anion gap: 6 (ref 5–15)
BUN: 15 mg/dL (ref 6–20)
CHLORIDE: 104 mmol/L (ref 101–111)
CO2: 29 mmol/L (ref 22–32)
CREATININE: 0.57 mg/dL (ref 0.44–1.00)
Calcium: 9.7 mg/dL (ref 8.9–10.3)
Glucose, Bld: 85 mg/dL (ref 65–99)
POTASSIUM: 4 mmol/L (ref 3.5–5.1)
Sodium: 139 mmol/L (ref 135–145)
Total Bilirubin: 0.3 mg/dL (ref 0.3–1.2)
Total Protein: 8 g/dL (ref 6.5–8.1)

## 2016-01-25 LAB — URINALYSIS COMPLETE WITH MICROSCOPIC (ARMC ONLY)
Bacteria, UA: NONE SEEN
Bilirubin Urine: NEGATIVE
Glucose, UA: NEGATIVE mg/dL
HGB URINE DIPSTICK: NEGATIVE
KETONES UR: NEGATIVE mg/dL
LEUKOCYTES UA: NEGATIVE
NITRITE: NEGATIVE
PH: 7 (ref 5.0–8.0)
PROTEIN: NEGATIVE mg/dL
SPECIFIC GRAVITY, URINE: 1.012 (ref 1.005–1.030)

## 2016-01-25 LAB — CBC
HCT: 42.2 % (ref 35.0–47.0)
Hemoglobin: 14.4 g/dL (ref 12.0–16.0)
MCH: 32.2 pg (ref 26.0–34.0)
MCHC: 34.1 g/dL (ref 32.0–36.0)
MCV: 94.3 fL (ref 80.0–100.0)
PLATELETS: 251 10*3/uL (ref 150–440)
RBC: 4.48 MIL/uL (ref 3.80–5.20)
RDW: 12.3 % (ref 11.5–14.5)
WBC: 7.2 10*3/uL (ref 3.6–11.0)

## 2016-01-25 LAB — LIPASE, BLOOD: LIPASE: 21 U/L (ref 11–51)

## 2016-01-25 MED ORDER — ONDANSETRON 4 MG PO TBDP
ORAL_TABLET | ORAL | Status: AC
  Administered 2016-01-25: 4 mg via ORAL
  Filled 2016-01-25: qty 1

## 2016-01-25 MED ORDER — ONDANSETRON 4 MG PO TBDP
4.0000 mg | ORAL_TABLET | Freq: Once | ORAL | Status: AC | PRN
  Administered 2016-01-25: 4 mg via ORAL

## 2016-01-25 NOTE — ED Triage Notes (Signed)
Patient with complaint of right flank pain, nausea, vomiting and migraine. Patient reports that her symptoms started Tuesday. Patient she had similar symptoms before with a  Kidney stone but did not have the migraine with previous stone.

## 2016-01-26 ENCOUNTER — Emergency Department: Payer: 59

## 2016-01-26 MED ORDER — PROCHLORPERAZINE MALEATE 10 MG PO TABS: 10.0000 mg | ORAL_TABLET | Freq: Four times a day (QID) | ORAL | 0 refills | Status: DC | PRN

## 2016-01-26 MED ORDER — DIPHENHYDRAMINE HCL 50 MG/ML IJ SOLN
12.5000 mg | Freq: Once | INTRAMUSCULAR | Status: AC
  Administered 2016-01-26: 12.5 mg via INTRAVENOUS
  Filled 2016-01-26: qty 1

## 2016-01-26 MED ORDER — DEXTROSE-NACL 5-0.45 % IV SOLN
INTRAVENOUS | Status: DC
  Administered 2016-01-26: 01:00:00 via INTRAVENOUS

## 2016-01-26 MED ORDER — KETOROLAC TROMETHAMINE 30 MG/ML IJ SOLN
10.0000 mg | Freq: Once | INTRAMUSCULAR | Status: DC
  Filled 2016-01-26: qty 1

## 2016-01-26 MED ORDER — ONDANSETRON 4 MG PO TBDP: 4.0000 mg | ORAL_TABLET | Freq: Three times a day (TID) | ORAL | 0 refills | Status: DC | PRN

## 2016-01-26 MED ORDER — PROCHLORPERAZINE EDISYLATE 5 MG/ML IJ SOLN
5.0000 mg | Freq: Once | INTRAMUSCULAR | Status: AC
  Administered 2016-01-26: 5 mg via INTRAVENOUS
  Filled 2016-01-26: qty 2

## 2016-01-26 NOTE — ED Notes (Signed)
Pt not in room, Dr Beather Arbour and Arrie Aran, [place] notified
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
## 2016-01-26 NOTE — Discharge Instructions (Signed)
1. You may take medicines as needed for headache and nausea (Compazine/Zofran #20). 2. Return to the ER for worsening symptoms, persistent vomiting, difficulty breathing or other concerns.

## 2016-01-26 NOTE — ED Provider Notes (Signed)
[facility] Emergency Department Provider Note   ____________________________________________   First MD Initiated Contact with Patient 01/26/16 0021     (approximate)  I have reviewed the triage vital signs and the nursing notes.   HISTORY  Chief Complaint Flank Pain; Abdominal Pain; and Emesis Migraine   HPI Maria Silva is a 30 y.o. female who presents to the ED from home with a chief complaint of migraine, right flank pain, nausea and vomiting. Patient reports frequent migraines that are usually controlled with Topamax and Imitrex. Gradual onset of typical left-sided migraine 5 days ago associated with nausea and vomiting. The following day patient began to experience waxing/waning right flank pain. History of kidney stone but does not recall if her flank pain is similar to kidney stone. Denies associated fever, chills, vision changes, chest pain, shortness of breath, abdominal pain, hematuria. Denies recent travel or trauma. Denies recent tick bite. Nothing makes her symptoms better or worse.   Past Medical History:  Diagnosis Date  . Asthma   . Benign tumor of cervix   . COPD (chronic obstructive pulmonary disease) ([place])   . Kidney stone   . Ovarian tumor   . Sternal fracture   . SVT (supraventricular tachycardia) (HCC)     There are no active problems to display for this patient.   Past Surgical History:  Procedure Laterality Date  . ABDOMINAL HYSTERECTOMY    . APPENDECTOMY    . BLADDER REPAIR    . BLADDER REPAIR W/ CESAREAN SECTION    . Laparascopic    . MINOR HEMORRHOIDECTOMY    . TUMOR REMOVAL      Prior to Admission medications   Medication Sig Start Date End Date Taking? Authorizing Provider  albuterol (PROVENTIL HFA;VENTOLIN HFA) 108 (90 BASE) MCG/ACT inhaler Inhale 2 puffs into the lungs every 6 (six) hours as needed for wheezing or shortness of breath. 10/09/14   Jenise V Bacon Menshew, PA-C  budesonide-formoterol  (SYMBICORT) 160-4.5 MCG/ACT inhaler Inhale 2 puffs into the lungs 2 (two) times daily.    Historical Provider, MD  cyclobenzaprine (FLEXERIL) 5 MG tablet Take 1 tablet (5 mg total) by mouth 3 (three) times daily as needed for muscle spasms. Patient not taking: Reported on 10/08/2015 10/09/14   Dannielle Karvonen Menshew, PA-C  diazepam (VALIUM) 2 MG tablet Take 1 tablet (2 mg total) by mouth every 8 (eight) hours as needed for muscle spasms. Patient not taking: Reported on 10/08/2015 10/07/14   Paulette Blanch, MD  estradiol (ESTRACE) 2 MG tablet Take 2 mg by mouth daily.    Historical Provider, MD  FLUoxetine (PROZAC) 20 MG capsule Take 20 mg by mouth daily.    Historical Provider, MD  ketorolac (TORADOL) 10 MG tablet Take 1 tablet (10 mg total) by mouth every 8 (eight) hours. Patient not taking: Reported on 10/08/2015 10/09/14   Dannielle Karvonen Menshew, PA-C  nitrofurantoin, macrocrystal-monohydrate, (MACROBID) 100 MG capsule Take 100 mg by mouth daily.    Historical Provider, MD  ondansetron (ZOFRAN ODT) 4 MG disintegrating tablet Take 1 tablet (4 mg total) by mouth every 8 (eight) hours as needed for nausea or vomiting. 01/26/16   Paulette Blanch, MD  oxyCODONE-acetaminophen (ROXICET) 5-325 MG tablet Take 1 tablet by mouth every 6 (six) hours as needed for severe pain. Patient not taking: Reported on 10/08/2015 07/01/15   Charline Bills Cuthriell, PA-C  prochlorperazine (COMPAZINE) 10 MG tablet Take 1 tablet (10 mg total) by mouth every 6 (  six) hours as needed for nausea. 01/26/16   Paulette Blanch, MD  progesterone (PROMETRIUM) 200 MG capsule Take 200 mg by mouth daily.    Historical Provider, MD  topiramate (TOPAMAX) 100 MG tablet Take 100 mg by mouth.    Historical Provider, MD    Allergies Amoxicillin; Tape; and Vicodin [hydrocodone-acetaminophen]  No family history on file.  Social History Social History  Substance Use Topics  . Smoking status: Former Research scientist (life sciences)  . Smokeless tobacco: Never Used  . Alcohol use Yes      Comment: occasionaly    Review of Systems  Constitutional: No fever/chills. Eyes: No visual changes. ENT: No sore throat. Cardiovascular: Denies chest pain. Respiratory: Denies shortness of breath. Gastrointestinal: No abdominal pain.  Positive for nausea and vomiting.  No diarrhea.  No constipation. Genitourinary: Negative for dysuria. Musculoskeletal: Positive for back pain. Skin: Negative for rash. Neurological: Positive for headache. Negative for focal weakness or numbness.  10-point ROS otherwise negative.  ____________________________________________   PHYSICAL EXAM:  VITAL SIGNS: ED Triage Vitals  Enc Vitals Group     BP 01/25/16 2151 129/86     Pulse Rate 01/25/16 2151 80     Resp 01/25/16 2151 18     Temp 01/25/16 2151 98 F (36.7 C)     Temp Source 01/25/16 2151 Oral     SpO2 01/25/16 2151 97 %     Weight 01/25/16 2152 149 lb (67.6 kg)     Height 01/25/16 2152 5\' 4"  (1.626 m)     Head Circumference --      Peak Flow --      Pain Score 01/25/16 2152 9     Pain Loc --      Pain Edu? --      Excl. in [place]? --     Constitutional: Alert and oriented. Well appearing and in mild acute distress. Eyes: Conjunctivae are normal. PERRL. EOMI. Head: Atraumatic. Nose: No congestion/rhinnorhea. Mouth/Throat: Mucous membranes are moist.  Oropharynx non-erythematous. Neck: No stridor.  Supple neck without meningismus.  No carotid bruits. Cardiovascular: Normal rate, regular rhythm. Grossly normal heart sounds.  Good peripheral circulation. Respiratory: Normal respiratory effort.  No retractions. Lungs CTAB. Gastrointestinal: Soft and nontender. No distention. No abdominal bruits. Mild right CVA tenderness. Musculoskeletal: No lower extremity tenderness nor edema.  No joint effusions. Neurologic:  Normal speech and language. No gross focal neurologic deficits are appreciated. No gait instability. Skin:  Skin is warm, dry and intact. No rash noted. No petechiae or  vesicles. Psychiatric: Mood and affect are normal. Speech and behavior are normal.  ____________________________________________   LABS (all labs ordered are listed, but only abnormal results are displayed)  Labs Reviewed  URINALYSIS COMPLETEWITH MICROSCOPIC ([place]) - Abnormal; Notable for the following:       Result Value   Color, Urine STRAW (*)    APPearance CLEAR (*)    Squamous Epithelial / LPF 0-5 (*)    All other components within normal limits  LIPASE, BLOOD  COMPREHENSIVE METABOLIC PANEL  CBC   ____________________________________________  EKG  None ____________________________________________  RADIOLOGY  CT Renal Colic Study interpreted per Dr. Radene Knee: Unremarkable noncontrast CT of the abdomen and pelvis. ____________________________________________   PROCEDURES  Procedure(s) performed: None  Procedures  Critical Care performed: No  ____________________________________________   INITIAL IMPRESSION / ASSESSMENT AND PLAN / ED COURSE  Pertinent labs & imaging results that were available during my care of the patient were reviewed by me and considered in my medical  decision making (see chart for details).  30 year old female who presents with typical migraine headache and right flank pain. Patient complains more about her migraine headache pain. Laboratory and urinalysis results are unremarkable. Will obtain CT renal colic study. Infuse D5 half normal saline, administer Compazine with Benadryl for headache and reassess.  Clinical Course  Comment By Time  Prior to administration of toradol, patient removed her IV and demanded to be discharged. Updated her of negative CT results. Will prescribe Compazine and Zofran, and patient will follow up with her PCP early next week. Paulette Blanch, MD 08/20 0201  Addendum on chart review: Patient eloped from room prior to receiving discharge instructions. Paulette Blanch, MD 08/20 [phone]      ____________________________________________   FINAL CLINICAL IMPRESSION(S) / ED DIAGNOSES  Final diagnoses:  Other migraine without status migrainosus, not intractable  Flank pain  Non-intractable vomiting with nausea, vomiting of unspecified type      NEW MEDICATIONS STARTED DURING THIS VISIT:  New Prescriptions   ONDANSETRON (ZOFRAN ODT) 4 MG DISINTEGRATING TABLET    Take 1 tablet (4 mg total) by mouth every 8 (eight) hours as needed for nausea or vomiting.   PROCHLORPERAZINE (COMPAZINE) 10 MG TABLET    Take 1 tablet (10 mg total) by mouth every 6 (six) hours as needed for nausea.     Note:  This document was prepared using Dragon voice recognition software and may include unintentional dictation errors.    Paulette Blanch, MD 01/26/16 [phone]

## 2016-01-26 NOTE — ED Notes (Signed)
Pt visitor seen looking for room in hallway, directed to room att

## 2016-02-27 ENCOUNTER — Encounter: Payer: Self-pay | Admitting: *Deleted

## 2016-02-27 ENCOUNTER — Ambulatory Visit (INDEPENDENT_AMBULATORY_CARE_PROVIDER_SITE_OTHER): Payer: 59

## 2016-02-27 ENCOUNTER — Ambulatory Visit
Admission: EM | Admit: 2016-02-27 | Discharge: 2016-02-27 | Disposition: A | Discharge status: Home / Self Care | Payer: 59 | Attending: Family Medicine | Admitting: Family Medicine

## 2016-02-27 DIAGNOSIS — M25512 Pain in left shoulder: Secondary | ICD-10-CM | POA: Diagnosis not present

## 2016-02-27 DIAGNOSIS — S63502A Unspecified sprain of left wrist, initial encounter: Secondary | ICD-10-CM | POA: Diagnosis not present

## 2016-02-27 DIAGNOSIS — S40012A Contusion of left shoulder, initial encounter: Secondary | ICD-10-CM

## 2016-02-27 MED ORDER — MELOXICAM 15 MG PO TABS: 15.0000 mg | ORAL_TABLET | Freq: Every day | ORAL | 0 refills | Status: DC

## 2016-02-27 MED ORDER — TRAMADOL HCL 50 MG PO TABS: 50.0000 mg | ORAL_TABLET | Freq: Three times a day (TID) | ORAL | 0 refills | Status: DC | PRN

## 2016-02-27 MED ORDER — KETOROLAC TROMETHAMINE 60 MG/2ML IM SOLN
60.0000 mg | Freq: Once | INTRAMUSCULAR | Status: AC
  Administered 2016-02-27: 60 mg via INTRAMUSCULAR

## 2016-02-27 NOTE — ED Provider Notes (Signed)
[facility]    CSN: [id number] Arrival date & time: 02/27/16  1039  First Provider Contact:  First MD Initiated Contact with Patient 02/27/16 1042        History   Chief Complaint Chief Complaint  Patient presents with  . Shoulder Pain  . Wrist Pain    HPI Errika Cindie Laroche is a 30 y.o. female.   Patient reports falling down steps today. She states she was fussing and her children for leaving the soccer ball bicycles out. She states that the top of the steps broke propelling her into the bike and soccer ball. She was not knocked out but hit this area on the ground will hard on her left side. She now has pain in the left shoulder area and left wrist and forearm area as well. She states she's has previous navicular fracture the right wrist she's also had multiple surgeries. She has a history of COPD she no longer smokes and she's had a hysterectomy because of cervical cancer she's also had a hemorrhoidectomy, appendectomy, bladder repair and C-section. She is allergic to penicillin and Vicodin.  No pertinent past family medical history relevant to today's visit   The history is provided by the patient. No language interpreter was used.  Shoulder Pain  Location:  Shoulder and wrist Shoulder location:  L shoulder Wrist location:  L wrist Injury: yes   Time since incident:  5 hours Mechanism of injury: fall   Fall:    Fall occurred:  Down stairs   Impact surface:  Engineer, maintenance (IT) of impact:  Outstretched arms   Entrapped after fall: no   Pain details:    Quality:  Pressure and shooting   Radiates to:  Does not radiate   Severity:  Moderate   Onset quality:  Sudden   Duration:  4 days   Timing:  Constant   Progression:  Worsening Dislocation: no   Foreign body present:  No foreign bodies Relieved by:  Nothing Worsened by:  Nothing Ineffective treatments:  None tried Associated symptoms: decreased range of motion, stiffness and  swelling   Associated symptoms: no back pain, no fatigue, no fever, no muscle weakness, no neck pain, no numbness and no tingling   Risk factors: no concern for non-accidental trauma, no known bone disorder, no frequent fractures and no recent illness   Wrist Pain     Past Medical History:  Diagnosis Date  . Asthma   . Benign tumor of cervix   . COPD (chronic obstructive pulmonary disease) ([place])   . Kidney stone   . Ovarian tumor   . Sternal fracture   . SVT (supraventricular tachycardia) ([facility])     There are no active problems to display for this patient.   Past Surgical History:  Procedure Laterality Date  . ABDOMINAL HYSTERECTOMY    . APPENDECTOMY    . BLADDER REPAIR    . BLADDER REPAIR W/ CESAREAN SECTION    . Laparascopic    . MINOR HEMORRHOIDECTOMY    . TUMOR REMOVAL      OB History    Gravida Para Term Preterm AB Living   3         3   SAB TAB Ectopic Multiple Live Births           3       Home Medications    Prior to Admission medications   Medication Sig Start Date End Date Taking? Authorizing Provider  budesonide-formoterol (SYMBICORT) 160-4.5 MCG/ACT inhaler Inhale 2 puffs into the lungs 2 (two) times daily.   Yes Historical Provider, MD  estradiol (ESTRACE) 2 MG tablet Take 2 mg by mouth daily.   Yes Historical Provider, MD  progesterone (PROMETRIUM) 200 MG capsule Take 200 mg by mouth daily.   Yes Historical Provider, MD  albuterol (PROVENTIL HFA;VENTOLIN HFA) 108 (90 BASE) MCG/ACT inhaler Inhale 2 puffs into the lungs every 6 (six) hours as needed for wheezing or shortness of breath. 10/09/14   Jenise V Bacon Menshew, PA-C  cyclobenzaprine (FLEXERIL) 5 MG tablet Take 1 tablet (5 mg total) by mouth 3 (three) times daily as needed for muscle spasms. Patient not taking: Reported on 10/08/2015 10/09/14   Dannielle Karvonen Menshew, PA-C  diazepam (VALIUM) 2 MG tablet Take 1 tablet (2 mg total) by mouth every 8 (eight) hours as needed for muscle spasms. Patient not  taking: Reported on 10/08/2015 10/07/14   Paulette Blanch, MD  FLUoxetine (PROZAC) 20 MG capsule Take 20 mg by mouth daily.    Historical Provider, MD  ketorolac (TORADOL) 10 MG tablet Take 1 tablet (10 mg total) by mouth every 8 (eight) hours. Patient not taking: Reported on 10/08/2015 10/09/14   Dannielle Karvonen Menshew, PA-C  meloxicam (MOBIC) 15 MG tablet Take 1 tablet (15 mg total) by mouth daily. 02/27/16   Frederich Cha, MD  nitrofurantoin, macrocrystal-monohydrate, (MACROBID) 100 MG capsule Take 100 mg by mouth daily.    Historical Provider, MD  ondansetron (ZOFRAN ODT) 4 MG disintegrating tablet Take 1 tablet (4 mg total) by mouth every 8 (eight) hours as needed for nausea or vomiting. 01/26/16   Paulette Blanch, MD  oxyCODONE-acetaminophen (ROXICET) 5-325 MG tablet Take 1 tablet by mouth every 6 (six) hours as needed for severe pain. Patient not taking: Reported on 10/08/2015 07/01/15   Charline Bills Cuthriell, PA-C  prochlorperazine (COMPAZINE) 10 MG tablet Take 1 tablet (10 mg total) by mouth every 6 (six) hours as needed for nausea. 01/26/16   Paulette Blanch, MD  topiramate (TOPAMAX) 100 MG tablet Take 100 mg by mouth.    Historical Provider, MD  traMADol (ULTRAM) 50 MG tablet Take 1 tablet (50 mg total) by mouth 3 (three) times daily as needed for moderate pain or severe pain. 02/27/16   Frederich Cha, MD    Family History Family History  Problem Relation Age of Onset  . Adopted: Yes    Social History Social History  Substance Use Topics  . Smoking status: Former Research scientist (life sciences)  . Smokeless tobacco: Never Used  . Alcohol use Yes     Comment: occasionaly     Allergies   Amoxicillin; Tape; and Vicodin [hydrocodone-acetaminophen]   Review of Systems Review of Systems  Constitutional: Negative for fatigue and fever.  Musculoskeletal: Positive for joint swelling, myalgias and stiffness. Negative for back pain and neck pain.  All other systems reviewed and are negative.    Physical Exam Triage Vital  Signs ED Triage Vitals  Enc Vitals Group     BP 02/27/16 1100 131/83     Pulse Rate 02/27/16 1100 62     Resp 02/27/16 1100 18     Temp 02/27/16 1100 98.1 F (36.7 C)     Temp Source 02/27/16 1100 Oral     SpO2 02/27/16 1100 99 %     Weight 02/27/16 1101 150 lb (68 kg)     Height 02/27/16 1101 5\' 5"  (1.651 m)     Head  Circumference --      Peak Flow --      Pain Score 02/27/16 1105 8     Pain Loc --      Pain Edu? --      Excl. in [place]? --    No data found.   Updated Vital Signs BP 131/83 (BP Location: Right Arm)   Pulse 62   Temp 98.1 F (36.7 C) (Oral)   Resp 18   Ht 5\' 5"  (1.651 m)   Wt 150 lb (68 kg)   SpO2 99%   BMI 24.96 kg/m   Visual Acuity Right Eye Distance:   Left Eye Distance:   Bilateral Distance:    Right Eye Near:   Left Eye Near:    Bilateral Near:     Physical Exam  Constitutional: She is oriented to person, place, and time. She appears well-developed and well-nourished.  HENT:  Head: Normocephalic and atraumatic.  Eyes: Pupils are equal, round, and reactive to light.  Neck: Normal range of motion. Neck supple.  Musculoskeletal: She exhibits tenderness.       Left shoulder: She exhibits tenderness, bony tenderness, pain and spasm. She exhibits no deformity and no laceration.       Arms: Patient has tenderness over the left shoulder and left forearm and over the left scapula the tenderness of the left arm also goes into the left snuffbox area as well.  Neurological: She is alert and oriented to person, place, and time.  Skin: Skin is warm.  Psychiatric: She has a normal mood and affect.  Vitals reviewed.    UC Treatments / Results  Labs (all labs ordered are listed, but only abnormal results are displayed) Labs Reviewed - No data to display  EKG  EKG Interpretation None       Radiology Dg Scapula Left  Result Date: 02/27/2016 CLINICAL DATA:  Fall down stairs with left shoulder pain, initial encounter EXAM: LEFT SCAPULA - 2+ VIEWS  COMPARISON:  None. FINDINGS: There is no evidence of fracture or other focal bone lesions. Soft tissues are unremarkable. IMPRESSION: No acute abnormality noted. Electronically Signed   By: Inez Catalina M.D.   On: 02/27/2016 12:00   Dg Wrist Complete Left  Result Date: 02/27/2016 CLINICAL DATA:  Fall downstairs with wrist pain, initial encounter EXAM: LEFT WRIST - COMPLETE 3+ VIEW COMPARISON:  None. FINDINGS: There is no evidence of fracture or dislocation. There is no evidence of arthropathy or other focal bone abnormality. Soft tissues are unremarkable. IMPRESSION: No acute abnormality noted. Electronically Signed   By: Inez Catalina M.D.   On: 02/27/2016 11:59   Dg Shoulder Left  Result Date: 02/27/2016 CLINICAL DATA:  Fall down stairs with left shoulder pain, initial encounter EXAM: LEFT SHOULDER - 2+ VIEW COMPARISON:  None. FINDINGS: No acute fracture or dislocation is noted. No gross soft tissue abnormality is seen. The underlying bony thorax is within normal limits. IMPRESSION: No acute abnormality noted. Electronically Signed   By: Inez Catalina M.D.   On: 02/27/2016 12:01    Procedures Procedures (including critical care time)  Medications Ordered in UC Medications  ketorolac (TORADOL) injection 60 mg (60 mg Intramuscular Given 02/27/16 1123)     Initial Impression / Assessment and Plan / UC Course  I have reviewed the triage vital signs and the nursing notes.  Pertinent labs & imaging results that were available during my care of the patient were reviewed by me and considered in my medical decision making (see  chart for details).  Clinical Course   X-rays negative of the shoulder and of the wrist and the scapula of these areas were tender to palpation. Because she has tenderness over the snuffbox replace in the thenar support and instructed to follow-up with her PCP in about 2 weeks and she'll need to keep the thenar support until she could be reevaluated to make sure does not  navicular fracture. She was looked at the [facility] drug reporting system and she's had Percocet tramadol in 2016.1 visit which she received Percocet this year and now is back in this winter. Also give her 60 Toradol IM and will place her on Mobic 15 mg also worked okay for today and tomorrow as well.   Final Clinical Impressions(s) / UC Diagnoses   Final diagnoses:  Left wrist sprain, initial encounter  Shoulder pain, acute, left  Shoulder contusion, left, initial encounter    New Prescriptions New Prescriptions   MELOXICAM (MOBIC) 15 MG TABLET    Take 1 tablet (15 mg total) by mouth daily.   TRAMADOL (ULTRAM) 50 MG TABLET    Take 1 tablet (50 mg total) by mouth 3 (three) times daily as needed for moderate pain or severe pain.     Frederich Cha, MD 02/27/16 1236

## 2016-02-27 NOTE — ED Triage Notes (Signed)
Patient fell this am injuring her left shoulder and left wrist.

## 2016-04-20 ENCOUNTER — Telehealth: Payer: Self-pay | Admitting: Obstetrics and Gynecology

## 2016-04-20 MED ORDER — ESTRADIOL 2 MG PO TABS: 2.0000 mg | ORAL_TABLET | Freq: Every day | ORAL | 0 refills | Status: DC

## 2016-04-20 MED ORDER — PROGESTERONE MICRONIZED 200 MG PO CAPS: 200.0000 mg | ORAL_CAPSULE | Freq: Every day | ORAL | 1 refills | Status: DC

## 2016-04-20 NOTE — Telephone Encounter (Signed)
Pt aware meds erx- 90 day supply.

## 2016-04-20 NOTE — Telephone Encounter (Signed)
Pt called and she is completely out of her Estradiol, and her progesterone. She had to reschedule her AE it is now in February but she needs a refill on these medications because she has to take them and she is completley out, she usus [facility] in [place] to you due to [name] not going to be here today.

## 2016-07-09 ENCOUNTER — Encounter: Payer: Self-pay | Admitting: Obstetrics and Gynecology

## 2016-07-09 ENCOUNTER — Ambulatory Visit (INDEPENDENT_AMBULATORY_CARE_PROVIDER_SITE_OTHER): Payer: Self-pay | Admitting: Obstetrics and Gynecology

## 2016-07-09 VITALS — BP 116/75 | HR 68 | Ht 64.0 in | Wt 163.8 lb

## 2016-07-09 DIAGNOSIS — Z79890 Hormone replacement therapy (postmenopausal): Secondary | ICD-10-CM

## 2016-07-09 DIAGNOSIS — Z01411 Encounter for gynecological examination (general) (routine) with abnormal findings: Secondary | ICD-10-CM

## 2016-07-09 DIAGNOSIS — E663 Overweight: Secondary | ICD-10-CM

## 2016-07-09 DIAGNOSIS — F3289 Other specified depressive episodes: Secondary | ICD-10-CM

## 2016-07-09 DIAGNOSIS — E8940 Asymptomatic postprocedural ovarian failure: Secondary | ICD-10-CM

## 2016-07-09 MED ORDER — BUPROPION HCL ER (XL) 150 MG PO TB24: 150.0000 mg | ORAL_TABLET | Freq: Every day | ORAL | 4 refills | Status: DC

## 2016-07-09 MED ORDER — ESTRADIOL 2 MG PO TABS: 2.0000 mg | ORAL_TABLET | Freq: Every day | ORAL | 0 refills | Status: DC

## 2016-07-09 MED ORDER — PROGESTERONE MICRONIZED 200 MG PO CAPS
200.0000 mg | ORAL_CAPSULE | Freq: Every day | ORAL | 1 refills | Status: DC
Start: 2016-07-09 — End: 2016-12-04

## 2016-07-09 MED ORDER — FLUOXETINE HCL 20 MG PO CAPS: 20.0000 mg | ORAL_CAPSULE | Freq: Every day | ORAL | 4 refills | Status: DC

## 2016-07-09 MED ORDER — TOPIRAMATE 100 MG PO TABS: 100.0000 mg | ORAL_TABLET | Freq: Every day | ORAL | 4 refills | Status: DC
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
## 2016-07-09 NOTE — Patient Instructions (Signed)
 Preventive Care 18-39 Years, Female Preventive care refers to lifestyle choices and visits with your health care provider that can promote health and wellness. What does preventive care include?  A yearly physical exam. This is also called an annual well check.  Dental exams once or twice a year.  Routine eye exams. Ask your health care provider how often you should have your eyes checked.  Personal lifestyle choices, including:  Daily care of your teeth and gums.  Regular physical activity.  Eating a healthy diet.  Avoiding tobacco and drug use.  Limiting alcohol use.  Practicing safe sex.  Taking vitamin and mineral supplements as recommended by your health care provider. What happens during an annual well check? The services and screenings done by your health care provider during your annual well check will depend on your age, overall health, lifestyle risk factors, and family history of disease. Counseling  Your health care provider may ask you questions about your:  Alcohol use.  Tobacco use.  Drug use.  Emotional well-being.  Home and relationship well-being.  Sexual activity.  Eating habits.  Work and work environment.  Method of birth control.  Menstrual cycle.  Pregnancy history. Screening  You may have the following tests or measurements:  Height, weight, and BMI.  Diabetes screening. This is done by checking your blood sugar (glucose) after you have not eaten for a while (fasting).  Blood pressure.  Lipid and cholesterol levels. These may be checked every 5 years starting at age 20.  Skin check.  Hepatitis C blood test.  Hepatitis B blood test.  Sexually transmitted disease (STD) testing.  BRCA-related cancer screening. This may be done if you have a family history of breast, ovarian, tubal, or peritoneal cancers.  Pelvic exam and Pap test. This may be done every 3 years starting at age 21. Starting at age 30, this may be done  every 5 years if you have a Pap test in combination with an HPV test. Discuss your test results, treatment options, and if necessary, the need for more tests with your health care provider. Vaccines  Your health care provider may recommend certain vaccines, such as:  Influenza vaccine. This is recommended every year.  Tetanus, diphtheria, and acellular pertussis (Tdap, Td) vaccine. You may need a Td booster every 10 years.  Varicella vaccine. You may need this if you have not been vaccinated.  HPV vaccine. If you are 26 or younger, you may need three doses over 6 months.  Measles, mumps, and rubella (MMR) vaccine. You may need at least one dose of MMR. You may also need a second dose.  Pneumococcal 13-valent conjugate (PCV13) vaccine. You may need this if you have certain conditions and were not previously vaccinated.  Pneumococcal polysaccharide (PPSV23) vaccine. You may need one or two doses if you smoke cigarettes or if you have certain conditions.  Meningococcal vaccine. One dose is recommended if you are age 19-21 years and a first-year college student living in a residence hall, or if you have one of several medical conditions. You may also need additional booster doses.  Hepatitis A vaccine. You may need this if you have certain conditions or if you travel or work in places where you may be exposed to hepatitis A.  Hepatitis B vaccine. You may need this if you have certain conditions or if you travel or work in places where you may be exposed to hepatitis B.  Haemophilus influenzae type b (Hib) vaccine. You may need   this if you have certain risk factors. Talk to your health care provider about which screenings and vaccines you need and how often you need them. This information is not intended to replace advice given to you by your health care provider. Make sure you discuss any questions you have with your health care provider. Document Released: 07/21/2001 Document Revised:  02/12/2016 Document Reviewed: 03/26/2015 Elsevier Interactive Patient Education  2017 [employer].

## 2016-07-16 ENCOUNTER — Encounter: Payer: Self-pay | Admitting: Obstetrics and Gynecology

## 2016-10-14 ENCOUNTER — Other Ambulatory Visit: Payer: Self-pay

## 2016-10-14 MED ORDER — ESTRADIOL 2 MG PO TABS: 2.0000 mg | ORAL_TABLET | Freq: Every day | ORAL | 3 refills | Status: DC

## 2016-11-04 ENCOUNTER — Other Ambulatory Visit: Payer: Self-pay | Admitting: Obstetrics and Gynecology

## 2016-11-04 ENCOUNTER — Telehealth: Payer: Self-pay | Admitting: Obstetrics and Gynecology

## 2016-11-04 MED ORDER — BUPROPION HCL ER (XL) 150 MG PO TB24: 300.0000 mg | ORAL_TABLET | Freq: Every day | ORAL | 4 refills | Status: DC

## 2016-11-04 NOTE — Telephone Encounter (Signed)
Done- new rx sent in; can double up on existing pills to equal next dose

## 2016-11-04 NOTE — Telephone Encounter (Signed)
Patient called to say she wants to go up on the dose of Wellbutrin Please call

## 2016-11-04 NOTE — Telephone Encounter (Signed)
pls advise

## 2016-11-05 NOTE — Telephone Encounter (Signed)
Notified pt. 

## 2016-12-04 ENCOUNTER — Ambulatory Visit (INDEPENDENT_AMBULATORY_CARE_PROVIDER_SITE_OTHER): Payer: Medicaid Other | Admitting: Obstetrics and Gynecology

## 2016-12-04 ENCOUNTER — Encounter: Payer: Self-pay | Admitting: Obstetrics and Gynecology

## 2016-12-04 VITALS — BP 101/77 | HR 76 | Ht 64.5 in | Wt 146.9 lb

## 2016-12-04 DIAGNOSIS — Z79899 Other long term (current) drug therapy: Secondary | ICD-10-CM | POA: Diagnosis not present

## 2016-12-04 DIAGNOSIS — F329 Major depressive disorder, single episode, unspecified: Secondary | ICD-10-CM | POA: Diagnosis not present

## 2016-12-04 MED ORDER — PROGESTERONE MICRONIZED 200 MG PO CAPS: 200.0000 mg | ORAL_CAPSULE | Freq: Every day | ORAL | 1 refills | Status: DC

## 2016-12-04 MED ORDER — BUPROPION HCL ER (XL) 300 MG PO TB24: 300.0000 mg | ORAL_TABLET | Freq: Every day | ORAL | 3 refills | Status: DC

## 2016-12-04 NOTE — Progress Notes (Signed)
Subjective:     Patient ID: Maria Silva, female   DOB: 1986-06-03, 31 y.o.   MRN: [id number]  HPI Seen 4 months ago for AE and started on Wellbutrin for depression and states is feeling almost 100% better.  Has lost 20 lbs. And feels much better with that. Got married two weeks ago. Spouse supportive and helping with kids more. Exercises daily. Review of Systems Negative     Objective:   Physical Exam A&O x4 Well groomed female in no distress  Blood pressure 101/77, pulse 76, height 5' 4.5" (1.638 m), weight 146 lb 14.4 oz (66.6 kg).  Body mass index is 24.83 kg/m.  No physical exam indicated     Assessment:     Anxiety Medication management    Plan:     To continue wellbutrin at 300mg  daily congradtulated on weight loss and encouraged continued healthy eating and exercising habits.   RTC as needed.    Aniceto Kyser,CNM

## 2017-02-05 ENCOUNTER — Telehealth: Payer: Self-pay | Admitting: Obstetrics and Gynecology

## 2017-02-05 NOTE — Telephone Encounter (Signed)
Patient has court coming up and she needs note about her medication and appointments.  Please call

## 2017-02-09 ENCOUNTER — Encounter: Payer: Self-pay | Admitting: *Deleted

## 2017-02-09 NOTE — Telephone Encounter (Signed)
Called pt she needs letter for court, done and mailed to pt

## 2017-03-01 ENCOUNTER — Telehealth: Payer: Self-pay | Admitting: Obstetrics and Gynecology

## 2017-03-01 ENCOUNTER — Encounter: Payer: Self-pay | Admitting: *Deleted

## 2017-03-01 NOTE — Telephone Encounter (Signed)
Called pt we discussed the letter she needed

## 2017-03-01 NOTE — Telephone Encounter (Signed)
Pt requested [name] to return her call about her letter for court. Please advise. Thanks TNP

## 2017-06-24 ENCOUNTER — Telehealth: Payer: Self-pay | Admitting: *Deleted

## 2017-06-24 NOTE — Telephone Encounter (Signed)
Patient needs a refill of her Tomapax . Patient takes she takes 150 mg daily and the RX was written for 100mg  a day. She was unable to fill her medication due the pharmacy stating it was too early. Patient is requesting a call back.  Her contact # is [phone]. Please advise. Thank you

## 2017-07-20 ENCOUNTER — Other Ambulatory Visit: Payer: Self-pay | Admitting: *Deleted

## 2017-07-20 MED ORDER — TOPIRAMATE 100 MG PO TABS: 100.0000 mg | ORAL_TABLET | Freq: Every day | ORAL | 4 refills | Status: DC

## 2017-07-20 NOTE — Telephone Encounter (Signed)
Done-ac 

## 2017-07-29 ENCOUNTER — Telehealth: Payer: Self-pay | Admitting: Obstetrics and Gynecology

## 2017-07-29 NOTE — Telephone Encounter (Signed)
The patient called and stated that she has yet to have her prescription filled. Her pharmacy has sent over several requests and she is out of her medication. Please advise.

## 2017-07-30 NOTE — Telephone Encounter (Signed)
No answer. Will try later

## 2017-08-02 ENCOUNTER — Other Ambulatory Visit: Payer: Self-pay | Admitting: *Deleted

## 2017-08-02 MED ORDER — PROGESTERONE MICRONIZED 200 MG PO CAPS: 200.0000 mg | ORAL_CAPSULE | Freq: Every day | ORAL | 1 refills | Status: DC

## 2017-08-02 NOTE — Telephone Encounter (Signed)
The patient called to check on the message she sent back Friday:  "The patient called and stated that she has yet to have her prescription filled. Her pharmacy has sent over several requests and she is out of her medication. Please advise."

## 2017-08-02 NOTE — Telephone Encounter (Signed)
Done-ac 

## 2017-10-07 ENCOUNTER — Other Ambulatory Visit: Payer: Self-pay | Admitting: Obstetrics and Gynecology

## 2017-10-07 ENCOUNTER — Ambulatory Visit (INDEPENDENT_AMBULATORY_CARE_PROVIDER_SITE_OTHER): Payer: Medicaid Other | Admitting: Obstetrics and Gynecology

## 2017-10-07 ENCOUNTER — Encounter: Payer: Self-pay | Admitting: Obstetrics and Gynecology

## 2017-10-07 VITALS — BP 105/79 | HR 73 | Ht 64.0 in | Wt 134.2 lb

## 2017-10-07 DIAGNOSIS — Z0000 Encounter for general adult medical examination without abnormal findings: Secondary | ICD-10-CM

## 2017-10-07 DIAGNOSIS — B3731 Acute candidiasis of vulva and vagina: Secondary | ICD-10-CM

## 2017-10-07 DIAGNOSIS — B373 Candidiasis of vulva and vagina: Secondary | ICD-10-CM

## 2017-10-07 DIAGNOSIS — Z87440 Personal history of urinary (tract) infections: Secondary | ICD-10-CM

## 2017-10-07 DIAGNOSIS — M2550 Pain in unspecified joint: Secondary | ICD-10-CM

## 2017-10-07 DIAGNOSIS — Z01419 Encounter for gynecological examination (general) (routine) without abnormal findings: Secondary | ICD-10-CM | POA: Diagnosis not present

## 2017-10-07 MED ORDER — BUDESONIDE-FORMOTEROL FUMARATE 160-4.5 MCG/ACT IN AERO: 2.0000 | INHALATION_SPRAY | Freq: Two times a day (BID) | RESPIRATORY_TRACT | 6 refills | Status: DC

## 2017-10-07 MED ORDER — FLUCONAZOLE 150 MG PO TABS: 150.0000 mg | ORAL_TABLET | Freq: Once | ORAL | 3 refills | Status: AC

## 2017-10-07 NOTE — Patient Instructions (Addendum)
Preventive Care 18-39 Years, Female Preventive care refers to lifestyle choices and visits with your health care provider that can promote health and wellness. What does preventive care include?  A yearly physical exam. This is also called an annual well check.  Dental exams once or twice a year.  Routine eye exams. Ask your health care provider how often you should have your eyes checked.  Personal lifestyle choices, including: ? Daily care of your teeth and gums. ? Regular physical activity. ? Eating a healthy diet. ? Avoiding tobacco and drug use. ? Limiting alcohol use. ? Practicing safe sex. ? Taking vitamin and mineral supplements as recommended by your health care provider. What happens during an annual well check? The services and screenings done by your health care provider during your annual well check will depend on your age, overall health, lifestyle risk factors, and family history of disease. Counseling Your health care provider may ask you questions about your:  Alcohol use.  Tobacco use.  Drug use.  Emotional well-being.  Home and relationship well-being.  Sexual activity.  Eating habits.  Work and work Statistician.  Method of birth control.  Menstrual cycle.  Pregnancy history.  Screening You may have the following tests or measurements:  Height, weight, and BMI.  Diabetes screening. This is done by checking your blood sugar (glucose) after you have not eaten for a while (fasting).  Blood pressure.  Lipid and cholesterol levels. These may be checked every 5 years starting at age 38.  Skin check.  Hepatitis C blood test.  Hepatitis B blood test.  Sexually transmitted disease (STD) testing.  BRCA-related cancer screening. This may be done if you have a family history of breast, ovarian, tubal, or peritoneal cancers.  Pelvic exam and Pap test. This may be done every 3 years starting at age 38. Starting at age 30, this may be done  every 5 years if you have a Pap test in combination with an HPV test.  Discuss your test results, treatment options, and if necessary, the need for more tests with your health care provider. Vaccines Your health care provider may recommend certain vaccines, such as:  Influenza vaccine. This is recommended every year.  Tetanus, diphtheria, and acellular pertussis (Tdap, Td) vaccine. You may need a Td booster every 10 years.  Varicella vaccine. You may need this if you have not been vaccinated.  HPV vaccine. If you are 39 or younger, you may need three doses over 6 months.  Measles, mumps, and rubella (MMR) vaccine. You may need at least one dose of MMR. You may also need a second dose.  Pneumococcal 13-valent conjugate (PCV13) vaccine. You may need this if you have certain conditions and were not previously vaccinated.  Pneumococcal polysaccharide (PPSV23) vaccine. You may need one or two doses if you smoke cigarettes or if you have certain conditions.  Meningococcal vaccine. One dose is recommended if you are age 68-21 years and a first-year college student living in a residence hall, or if you have one of several medical conditions. You may also need additional booster doses.  Hepatitis A vaccine. You may need this if you have certain conditions or if you travel or work in places where you may be exposed to hepatitis A.  Hepatitis B vaccine. You may need this if you have certain conditions or if you travel or work in places where you may be exposed to hepatitis B.  Haemophilus influenzae type b (Hib) vaccine. You may need this  if you have certain risk factors.  Talk to your health care provider about which screenings and vaccines you need and how often you need them. This information is not intended to replace advice given to you by your health care provider. Make sure you discuss any questions you have with your health care provider. Document Released: 07/21/2001 Document Revised:  02/12/2016 Document Reviewed: 03/26/2015 Elsevier Interactive Patient Education  2018 [place] you for enrolling in [place]. Please follow the instructions below to securely access your online medical record. MyChart allows you to send messages to your doctor, view your test results, renew your prescriptions, schedule appointments, and more.  How Do I Sign Up? 1. In your Internet browser, go to [email or web address]. 2. Click on the New  User? link in the Sign In box.  3. Enter your MyChart Access Code exactly as it appears below. You will not need to use this code after you have completed the sign-up process. If you do not sign up before the expiration date, you must request a new code. MyChart Access Code: [email or web address] Expires: 11/21/2017  9:44 AM  4. Enter the last four digits of your Social Security Number (xxxx) and Date of Birth (mm/dd/yyyy) as indicated and click Next. You will be taken to the next sign-up page. 5. Create a MyChart ID. This will be your MyChart login ID and cannot be changed, so think of one that is secure and easy to remember. 6. Create a MyChart password. You can change your password at any time. 7. Enter your Password Reset Question and Answer and click Next. This can be used at a later time if you forget your password.  8. Select your communication preference, and if applicable enter your e-mail address. You will receive e-mail notification when new information is available in MyChart by choosing to receive e-mail notifications and filling in your e-mail. 9. Click Sign In. You can now view your medical record.   Additional Information If you have questions, you can email [email or web address] or call [phone] to talk to our [place] staff. Remember, MyChart is NOT to be used for urgent needs. For medical emergencies, dial 911.

## 2017-10-07 NOTE — Progress Notes (Signed)
Subjective:   Maria Silva is a 32 y.o. G75P0 Caucasian female here for a routine well-woman exam.  No LMP recorded. Patient has had a hysterectomy.    Current complaints: occasional right flank pain- s/p renal stent and stone removal. Continues with frequent urination. And vaginal skin irritation- using OTC vagisil as needed.  Generalized joint pain PCP: [facility] in [place]       does desire labs  Social History: Sexual: heterosexual Marital Status: married Living situation: with family Occupation: Scientist, clinical (histocompatibility and immunogenetics) at [employer] Tobacco/alcohol: no tobacco use Illicit drugs: no history of illicit drug use  The following portions of the patient's history were reviewed and updated as appropriate: allergies, current medications, past family history, past medical history, past social history, past surgical history and problem list.  Past Medical History Past Medical History:  Diagnosis Date  . Asthma   . Benign tumor of cervix   . COPD (chronic obstructive pulmonary disease) ([place])   . Kidney stone   . Ovarian tumor   . Sternal fracture   . SVT (supraventricular tachycardia) [facility])     Past Surgical History Past Surgical History:  Procedure Laterality Date  . ABDOMINAL HYSTERECTOMY    . APPENDECTOMY    . BLADDER REPAIR    . BLADDER REPAIR W/ CESAREAN SECTION    . bladder stent    . Laparascopic    . MINOR HEMORRHOIDECTOMY    . TUMOR REMOVAL      Gynecologic History G3P0  No LMP recorded. Patient has had a hysterectomy. Contraception: status post hysterectomy Last Pap: ?Marland Kitchen Results were: normal   Obstetric History OB History  Gravida Para Term Preterm AB Living  3         3  SAB TAB Ectopic Multiple Live Births          3    # Outcome Date GA Lbr Len/2nd Weight Sex Delivery Anes PTL Lv  3 Gravida 2010    M CS-Unspec   LIV  2 Gravida 2009    M CS-Unspec   LIV  1 Gravida 2008    F Vag-Spont   LIV    Current Medications Current Outpatient Medications  on File Prior to Visit  Medication Sig Dispense Refill  . albuterol (PROVENTIL HFA;VENTOLIN HFA) 108 (90 BASE) MCG/ACT inhaler Inhale 2 puffs into the lungs every 6 (six) hours as needed for wheezing or shortness of breath. 1 Inhaler 0  . buPROPion (WELLBUTRIN XL) 300 MG 24 hr tablet Take 1 tablet (300 mg total) by mouth daily. 90 tablet 3  . estradiol (ESTRACE) 2 MG tablet Take 1 tablet (2 mg total) by mouth daily. 90 tablet 3  . progesterone (PROMETRIUM) 200 MG capsule Take 1 capsule (200 mg total) by mouth daily. 90 capsule 1  . topiramate (TOPAMAX) 100 MG tablet Take 1 tablet (100 mg total) by mouth daily. Pt takes 150mg  daily 120 tablet 4  . nitrofurantoin, macrocrystal-monohydrate, (MACROBID) 100 MG capsule Take 100 mg by mouth daily.     No current facility-administered medications on file prior to visit.     Review of Systems Patient denies any headaches, blurred vision, shortness of breath, chest pain, abdominal pain, problems with bowel movements, urination, or intercourse.  Objective:  BP 105/79   Pulse 73   Ht 5\' 4"  (1.626 m)   Wt 134 lb 3.2 oz (60.9 kg)   BMI 23.04 kg/m  Physical Exam  General:  Well developed, well nourished, no acute distress. She  is alert and oriented x3. Skin:  Warm and dry Neck:  Midline trachea, no thyromegaly or nodules Cardiovascular: Regular rate and rhythm, no murmur heard Lungs:  Effort normal, all lung fields clear to auscultation bilaterally Breasts:  No dominant palpable mass, retraction, or nipple discharge Abdomen:  Soft, non tender, no hepatosplenomegaly or masses Pelvic:  External genitalia is normal in appearance.  The vagina is normal in appearance. The cervical cuff is normal but increased white d/c , no CMT. Microscopic wet-mount exam shows hyphae, lactobacilli, DNA probe for chlamydia and GC obtained. Thin prep pap is done with HR HPV cotesting. Uterus is surgically absent  No adnexal masses or tenderness noted. Extremities:  No  swelling or varicosities noted Psych:  She has a normal mood and affect  Assessment:   Healthy well-woman exam S/p hysterectomy. S/p renal stone HRT Yeast infection joint pain-generalized   Plan:  Labs obtained-will follow up accordingly Referred to re-establish care with urology and [facility]CP rx for diflucan sent in F/U 1 year for AE, or sooner if needed   Melody Rockney Ghee, CNM

## 2017-10-11 ENCOUNTER — Other Ambulatory Visit: Payer: Self-pay | Admitting: *Deleted

## 2017-10-11 ENCOUNTER — Encounter: Payer: Self-pay | Admitting: Obstetrics and Gynecology

## 2017-10-11 ENCOUNTER — Telehealth: Payer: Self-pay | Admitting: Obstetrics and Gynecology

## 2017-10-11 LAB — COMPREHENSIVE METABOLIC PANEL
A/G RATIO: 1.8 (ref 1.2–2.2)
ALBUMIN: 4.5 g/dL (ref 3.5–5.5)
ALT: 12 IU/L (ref 0–32)
AST: 15 IU/L (ref 0–40)
Alkaline Phosphatase: 55 IU/L (ref 39–117)
BUN/Creatinine Ratio: 15 (ref 9–23)
BUN: 12 mg/dL (ref 6–20)
Bilirubin Total: 0.3 mg/dL (ref 0.0–1.2)
CO2: 18 mmol/L — ABNORMAL LOW (ref 20–29)
Calcium: 9.1 mg/dL (ref 8.7–10.2)
Chloride: 111 mmol/L — ABNORMAL HIGH (ref 96–106)
Creatinine, Ser: 0.82 mg/dL (ref 0.57–1.00)
GFR, EST AFRICAN AMERICAN: 111 mL/min/{1.73_m2} (ref >59)
GFR, EST NON AFRICAN AMERICAN: 96 mL/min/{1.73_m2} (ref >59)
GLOBULIN, TOTAL: 2.5 g/dL (ref 1.5–4.5)
Glucose: 78 mg/dL (ref 65–99)
POTASSIUM: 4.3 mmol/L (ref 3.5–5.2)
SODIUM: 141 mmol/L (ref 134–144)
TOTAL PROTEIN: 7 g/dL (ref 6.0–8.5)

## 2017-10-11 LAB — CBC
Hematocrit: 40.8 % (ref 34.0–46.6)
Hemoglobin: 13.4 g/dL (ref 11.1–15.9)
MCH: 31.5 pg (ref 26.6–33.0)
MCHC: 32.8 g/dL (ref 31.5–35.7)
MCV: 96 fL (ref 79–97)
PLATELETS: 298 10*3/uL (ref 150–379)
RBC: 4.26 x10E6/uL (ref 3.77–5.28)
RDW: 12.5 % (ref 12.3–15.4)
WBC: 3.2 10*3/uL — AB (ref 3.4–10.8)

## 2017-10-11 LAB — VITAMIN D 25 HYDROXY (VIT D DEFICIENCY, FRACTURES): Vit D, 25-Hydroxy: 22.1 ng/mL — ABNORMAL LOW (ref 30.0–100.0)

## 2017-10-11 LAB — THYROID PANEL WITH TSH
FREE THYROXINE INDEX: 2.3 (ref 1.2–4.9)
T3 Uptake Ratio: 23 % — ABNORMAL LOW (ref 24–39)
T4 TOTAL: 10.2 ug/dL (ref 4.5–12.0)
TSH: 2.08 u[IU]/mL (ref 0.450–4.500)

## 2017-10-11 LAB — FERRITIN: FERRITIN: 50 ng/mL (ref 15–150)

## 2017-10-11 LAB — B12 AND FOLATE PANEL
FOLATE: 8.5 ng/mL (ref >3.0)
Vitamin B-12: 312 pg/mL (ref 232–1245)

## 2017-10-11 LAB — LIPID PANEL
CHOLESTEROL TOTAL: 225 mg/dL — AB (ref 100–199)
Chol/HDL Ratio: 3.8 ratio (ref 0.0–4.4)
HDL: 59 mg/dL (ref >39)
LDL Calculated: 141 mg/dL — ABNORMAL HIGH (ref 0–99)
TRIGLYCERIDES: 126 mg/dL (ref 0–149)
VLDL Cholesterol Cal: 25 mg/dL (ref 5–40)

## 2017-10-11 LAB — NUSWAB VAGINITIS PLUS (VG+)
CANDIDA ALBICANS, NAA: NEGATIVE
CHLAMYDIA TRACHOMATIS, NAA: NEGATIVE
Candida glabrata, NAA: NEGATIVE
NEISSERIA GONORRHOEAE, NAA: NEGATIVE
Trich vag by NAA: NEGATIVE

## 2017-10-11 LAB — ANA,IFA RA DIAG PNL W/RFLX TIT/PATN
ANA Titer 1: NEGATIVE
CYCLIC CITRULLIN PEPTIDE AB: 7 U (ref 0–19)
Rhuematoid fact SerPl-aCnc: <10 IU/mL (ref 0.0–13.9)

## 2017-10-11 LAB — CYTOLOGY - PAP

## 2017-10-11 MED ORDER — TERCONAZOLE 0.4 % VA CREA: 1.0000 | TOPICAL_CREAM | Freq: Every day | VAGINAL | 1 refills | Status: DC

## 2017-10-11 NOTE — Telephone Encounter (Signed)
Sent pt in some terazol cream

## 2017-10-11 NOTE — Telephone Encounter (Signed)
The patient called and stated that she would like to speak with Melody or [name] in regards to her medication not working. Please advise.

## 2017-10-12 ENCOUNTER — Other Ambulatory Visit: Payer: Self-pay | Admitting: Obstetrics and Gynecology

## 2017-10-12 ENCOUNTER — Telehealth: Payer: Self-pay | Admitting: *Deleted

## 2017-10-12 DIAGNOSIS — E7800 Pure hypercholesterolemia, unspecified: Secondary | ICD-10-CM | POA: Insufficient documentation

## 2017-10-12 DIAGNOSIS — E559 Vitamin D deficiency, unspecified: Secondary | ICD-10-CM | POA: Insufficient documentation

## 2017-10-12 MED ORDER — VITAMIN D (ERGOCALCIFEROL) 1.25 MG (50000 UNIT) PO CAPS: 50000.0000 [IU] | ORAL_CAPSULE | ORAL | 1 refills | Status: DC

## 2017-10-12 NOTE — Telephone Encounter (Signed)
-----   Message from Joylene Igo, [place] sent at 10/12/2017 10:34 AM EDT ----- Please see result note and notify patient

## 2017-10-12 NOTE — Telephone Encounter (Signed)
Mailed all info to pt 

## 2017-10-13 ENCOUNTER — Telehealth: Payer: Self-pay | Admitting: Obstetrics and Gynecology

## 2017-10-13 NOTE — Telephone Encounter (Signed)
The patient called and stated that she would like to speak with [name] in regards to the phone call she might have missed yesterday and possible results. Please advise.

## 2017-10-19 ENCOUNTER — Telehealth: Payer: Self-pay | Admitting: *Deleted

## 2017-10-19 NOTE — Telephone Encounter (Signed)
Patient called and is inquiring about her lab results . She is also wanting to talk about a medication that Melody prescribed to her. The vitamin D. Her contact # [phone]. Please advise. Thank you

## 2017-10-21 ENCOUNTER — Encounter: Payer: Self-pay | Admitting: Emergency Medicine

## 2017-10-21 ENCOUNTER — Emergency Department: Payer: Medicaid Other

## 2017-10-21 ENCOUNTER — Emergency Department
Admission: EM | Admit: 2017-10-21 | Discharge: 2017-10-21 | Disposition: A | Discharge status: Home / Self Care | Payer: Medicaid Other | Attending: Emergency Medicine | Admitting: Emergency Medicine

## 2017-10-21 DIAGNOSIS — J45909 Unspecified asthma, uncomplicated: Secondary | ICD-10-CM | POA: Diagnosis not present

## 2017-10-21 DIAGNOSIS — R109 Unspecified abdominal pain: Secondary | ICD-10-CM

## 2017-10-21 DIAGNOSIS — Z87891 Personal history of nicotine dependence: Secondary | ICD-10-CM | POA: Insufficient documentation

## 2017-10-21 DIAGNOSIS — R1031 Right lower quadrant pain: Secondary | ICD-10-CM | POA: Diagnosis present

## 2017-10-21 LAB — CBC
HCT: 41.9 % (ref 35.0–47.0)
Hemoglobin: 14.4 g/dL (ref 12.0–16.0)
MCH: 33.2 pg (ref 26.0–34.0)
MCHC: 34.3 g/dL (ref 32.0–36.0)
MCV: 96.8 fL (ref 80.0–100.0)
PLATELETS: 270 10*3/uL (ref 150–440)
RBC: 4.33 MIL/uL (ref 3.80–5.20)
RDW: 12.7 % (ref 11.5–14.5)
WBC: 3.1 10*3/uL — ABNORMAL LOW (ref 3.6–11.0)

## 2017-10-21 LAB — BASIC METABOLIC PANEL
ANION GAP: 4 — AB (ref 5–15)
BUN: 10 mg/dL (ref 6–20)
CO2: 22 mmol/L (ref 22–32)
Calcium: 9.2 mg/dL (ref 8.9–10.3)
Chloride: 111 mmol/L (ref 101–111)
Creatinine, Ser: 0.8 mg/dL (ref 0.44–1.00)
GFR calc Af Amer: >60 mL/min (ref >60)
GLUCOSE: 94 mg/dL (ref 65–99)
POTASSIUM: 4.1 mmol/L (ref 3.5–5.1)
Sodium: 137 mmol/L (ref 135–145)

## 2017-10-21 LAB — URINALYSIS, COMPLETE (UACMP) WITH MICROSCOPIC
BILIRUBIN URINE: NEGATIVE
Glucose, UA: NEGATIVE mg/dL
HGB URINE DIPSTICK: NEGATIVE
KETONES UR: NEGATIVE mg/dL
Leukocytes, UA: NEGATIVE
NITRITE: NEGATIVE
PROTEIN: NEGATIVE mg/dL
Specific Gravity, Urine: 1.014 (ref 1.005–1.030)
pH: 8 (ref 5.0–8.0)

## 2017-10-21 LAB — PREGNANCY, URINE: Preg Test, Ur: NEGATIVE

## 2017-10-21 MED ORDER — KETOROLAC TROMETHAMINE 30 MG/ML IJ SOLN
30.0000 mg | Freq: Once | INTRAMUSCULAR | Status: AC
  Administered 2017-10-21: 30 mg via INTRAVENOUS
  Filled 2017-10-21: qty 1

## 2017-10-21 MED ORDER — HYDROMORPHONE HCL 1 MG/ML IJ SOLN
1.0000 mg | Freq: Once | INTRAMUSCULAR | Status: AC
  Administered 2017-10-21: 1 mg via INTRAVENOUS
  Filled 2017-10-21: qty 1

## 2017-10-21 MED ORDER — ONDANSETRON 4 MG PO TBDP: 4.0000 mg | ORAL_TABLET | Freq: Three times a day (TID) | ORAL | 0 refills | Status: DC | PRN

## 2017-10-21 MED ORDER — OXYCODONE-ACETAMINOPHEN 5-325 MG PO TABS
2.0000 | ORAL_TABLET | Freq: Once | ORAL | Status: AC
  Administered 2017-10-21: 2 via ORAL
  Filled 2017-10-21: qty 2

## 2017-10-21 MED ORDER — ONDANSETRON HCL 4 MG/2ML IJ SOLN
4.0000 mg | Freq: Once | INTRAMUSCULAR | Status: AC
  Administered 2017-10-21: 4 mg via INTRAVENOUS
  Filled 2017-10-21: qty 2

## 2017-10-21 MED ORDER — SODIUM CHLORIDE 0.9 % IV SOLN: Freq: Once | INTRAVENOUS | Status: DC

## 2017-10-21 MED ORDER — OXYCODONE-ACETAMINOPHEN 5-325 MG PO TABS: 1.0000 | ORAL_TABLET | Freq: Three times a day (TID) | ORAL | 0 refills | Status: DC | PRN

## 2017-10-21 NOTE — ED Notes (Signed)
Patient transported to CT 

## 2017-10-21 NOTE — ED Provider Notes (Signed)
[facility] Emergency Department Provider Note       Time seen: ----------------------------------------- 11:12 AM on 10/21/2017 -----------------------------------------   I have reviewed the triage vital signs and the nursing notes.  HISTORY   Chief Complaint Flank Pain   HPI Maria Silva is a 32 y.o. female with a history of asthma, COPD, kidney stones, ovarian tumors, SVT who presents to the ED for right flank pain for the past 2 weeks.  Patient reports a history of same and had to have stents placed for renal colic.  This was done in this past November and December.  She denies fevers, chills or other complaints.  Past Medical History:  Diagnosis Date  . Asthma   . Benign tumor of cervix   . COPD (chronic obstructive pulmonary disease) ([place])   . Kidney stone   . Ovarian tumor   . Sternal fracture   . SVT (supraventricular tachycardia) [facility])     Patient Active Problem List   Diagnosis Date Noted  . Vitamin D deficiency 10/12/2017  . Elevated cholesterol 10/12/2017    Past Surgical History:  Procedure Laterality Date  . ABDOMINAL HYSTERECTOMY    . APPENDECTOMY    . BLADDER REPAIR    . BLADDER REPAIR W/ CESAREAN SECTION    . bladder stent    . Laparascopic    . MINOR HEMORRHOIDECTOMY    . TUMOR REMOVAL      Allergies Amoxicillin; Tape; and Vicodin [hydrocodone-acetaminophen]  Social History Social History   Tobacco Use  . Smoking status: Former Research scientist (life sciences)  . Smokeless tobacco: Never Used  Substance Use Topics  . Alcohol use: Yes    Comment: occasionaly  . Drug use: No   Review of Systems Constitutional: Negative for fever. Cardiovascular: Negative for chest pain. Respiratory: Negative for shortness of breath. Gastrointestinal positive for flank pain Genitourinary: Negative for dysuria. Musculoskeletal: Negative for back pain. Skin: Negative for rash. Neurological: Negative for headaches, focal weakness or  numbness.  All systems negative/normal/unremarkable except as stated in the HPI  ____________________________________________   PHYSICAL EXAM:  VITAL SIGNS: ED Triage Vitals  Enc Vitals Group     BP 10/21/17 0954 112/89     Pulse Rate 10/21/17 0954 77     Resp 10/21/17 0954 18     Temp 10/21/17 0954 98.8 F (37.1 C)     Temp Source 10/21/17 0954 Oral     SpO2 10/21/17 0954 99 %     Weight 10/21/17 0955 134 lb (60.8 kg)     Height 10/21/17 0955 5\' 4"  (1.626 m)     Head Circumference --      Peak Flow --      Pain Score 10/21/17 0955 9     Pain Loc --      Pain Edu? --      Excl. in [place]? --    Constitutional: Alert and oriented. Well appearing and in no distress. Eyes: Conjunctivae are normal. Normal extraocular movements. ENT   Head: Normocephalic and atraumatic.   Nose: No congestion/rhinnorhea.   Mouth/Throat: Mucous membranes are moist.   Neck: No stridor. Cardiovascular: Normal rate, regular rhythm. No murmurs, rubs, or gallops. Respiratory: Normal respiratory effort without tachypnea nor retractions. Breath sounds are clear and equal bilaterally. No wheezes/rales/rhonchi. Gastrointestinal: Soft and nontender. Normal bowel sounds Musculoskeletal: Nontender with normal range of motion in extremities. No lower extremity tenderness nor edema. Neurologic:  Normal speech and language. No gross focal neurologic deficits are appreciated.  Skin:  Skin is warm, dry and intact. No rash noted. Psychiatric: Mood and affect are normal. Speech and behavior are normal.  ____________________________________________  ED COURSE:  As part of my medical decision making, I reviewed the following data within the [place] History obtained from family if available, nursing notes, old chart and ekg, as well as notes from prior ED visits. Patient presented for flank pain, we will assess with labs and imaging as indicated at this time.    Procedures ____________________________________________   LABS (pertinent positives/negatives)  Labs Reviewed  URINALYSIS, COMPLETE (UACMP) WITH MICROSCOPIC - Abnormal; Notable for the following components:      Result Value   Color, Urine YELLOW (*)    APPearance CLOUDY (*)    Bacteria, UA FEW (*)    All other components within normal limits  CBC - Abnormal; Notable for the following components:   WBC 3.1 (*)    All other components within normal limits  BASIC METABOLIC PANEL - Abnormal; Notable for the following components:   Anion gap 4 (*)    All other components within normal limits  PREGNANCY, URINE    RADIOLOGY Images were viewed by me  CT renal protocol Does not reveal any acute process ____________________________________________  DIFFERENTIAL DIAGNOSIS   Renal colic, UTI, muscle strain, pyelonephritis, chronic pain  FINAL ASSESSMENT AND PLAN  Flank pain   Plan: The patient had presented for right flank pain. Patient's labs were reassuring. Patient's imaging not reveal any acute process.  She reports in the past she had stent placement after a negative CT scan.  She was discharged with pain medicine and is cleared for outpatient follow-up with a urologist.   Laurence Aly, MD   Note: This note was generated in part or whole with voice recognition software. Voice recognition is usually quite accurate but there are transcription errors that can and very often do occur. I apologize for any typographical errors that were not detected and corrected.     Earleen Newport, MD 10/21/17 [phone]

## 2017-10-21 NOTE — ED Notes (Signed)
Pt returned from CT °

## 2017-10-21 NOTE — ED Triage Notes (Signed)
Pt reports right flank pain intermittently for the past 2 weeks. Pt reports hx of the same and had to have stents placed for stones.

## 2017-10-21 NOTE — ED Notes (Signed)
Notified Dr. Jimmye Norman about pt's severe pain.

## 2017-10-21 NOTE — ED Notes (Signed)
First Nurse Note:  Patient complaining of right flank pain.  States she had 2 stents placed in Nov. And removed in December.

## 2017-10-25 ENCOUNTER — Other Ambulatory Visit: Payer: Self-pay | Admitting: Obstetrics and Gynecology

## 2017-10-29 NOTE — Telephone Encounter (Signed)
Spoke with pt

## 2017-11-18 ENCOUNTER — Ambulatory Visit
Admission: EM | Admit: 2017-11-18 | Discharge: 2017-11-18 | Disposition: A | Discharge status: Home / Self Care | Payer: Medicaid Other | Attending: Family Medicine | Admitting: Family Medicine

## 2017-11-18 ENCOUNTER — Other Ambulatory Visit: Payer: Self-pay

## 2017-11-18 ENCOUNTER — Encounter: Payer: Self-pay | Admitting: Emergency Medicine

## 2017-11-18 DIAGNOSIS — S60562A Insect bite (nonvenomous) of left hand, initial encounter: Secondary | ICD-10-CM | POA: Diagnosis not present

## 2017-11-18 DIAGNOSIS — W57XXXA Bitten or stung by nonvenomous insect and other nonvenomous arthropods, initial encounter: Secondary | ICD-10-CM | POA: Diagnosis not present

## 2017-11-18 DIAGNOSIS — L03114 Cellulitis of left upper limb: Secondary | ICD-10-CM

## 2017-11-18 HISTORY — DX: Migraine, unspecified, not intractable, without status migrainosus: G43.909

## 2017-11-18 MED ORDER — MUPIROCIN 2 % EX OINT: 1.0000 "application " | TOPICAL_OINTMENT | Freq: Three times a day (TID) | CUTANEOUS | 0 refills | Status: DC

## 2017-11-18 MED ORDER — DOXYCYCLINE HYCLATE 100 MG PO CAPS: 100.0000 mg | ORAL_CAPSULE | Freq: Two times a day (BID) | ORAL | 0 refills | Status: DC

## 2017-11-18 NOTE — ED Provider Notes (Signed)
[facility]    CSN: [id number] Arrival date & time: 11/18/17  0841     History   Chief Complaint Chief Complaint  Patient presents with  . Cellulitis    left index    HPI Maria Silva is a 32 y.o. female.   HPI  32 year old female presents accompanied by her husband stating that she has had left dorsal index finger pain redness and swelling for 5 days.  [place] bite.  She remembers the bite but does not remember seeing any insects.  Afterwards she began to have itching and has progressively worsened.  She has on several occasions attempt to squeeze the area to extrude pus but has only been able to get clear fluid or blood.  Not been using the finger due to the swelling.  The bite is centered over the proximal phalanx midpoint over the dorsum.  Is erythematous with a central punctate area.        Past Medical History:  Diagnosis Date  . Asthma   . Benign tumor of cervix   . COPD (chronic obstructive pulmonary disease) ([place])   . Kidney stone   . Migraine   . Ovarian tumor   . Sternal fracture   . SVT (supraventricular tachycardia) [facility])     Patient Active Problem List   Diagnosis Date Noted  . Vitamin D deficiency 10/12/2017  . Elevated cholesterol 10/12/2017    Past Surgical History:  Procedure Laterality Date  . ABDOMINAL HYSTERECTOMY    . APPENDECTOMY    . BLADDER REPAIR    . BLADDER REPAIR W/ CESAREAN SECTION    . bladder stent    . Laparascopic    . MINOR HEMORRHOIDECTOMY    . TUMOR REMOVAL      OB History    Gravida  3   Para      Term      Preterm      AB      Living  3     SAB      TAB      Ectopic      Multiple      Live Births  3            Home Medications    Prior to Admission medications   Medication Sig Start Date End Date Taking? Authorizing Provider  budesonide-formoterol (SYMBICORT) 160-4.5 MCG/ACT inhaler Inhale 2 puffs into the lungs 2 (two)  times daily. 10/07/17  Yes Shambley, Melody N, CNM  buPROPion (WELLBUTRIN XL) 300 MG 24 hr tablet Take 1 tablet (300 mg total) by mouth daily. 12/04/16  Yes Shambley, Melody N, CNM  estradiol (ESTRACE) 2 MG tablet TAKE 1 TABLET BY MOUTH ONCE DAILY 10/25/17  Yes Shambley, Melody N, CNM  ondansetron (ZOFRAN ODT) 4 MG disintegrating tablet Take 1 tablet (4 mg total) by mouth every 8 (eight) hours as needed for nausea or vomiting. 10/21/17  Yes Earleen Newport, MD  oxyCODONE-acetaminophen (PERCOCET) 5-325 MG tablet Take 1-2 tablets by mouth every 8 (eight) hours as needed. 10/21/17  Yes Earleen Newport, MD  progesterone (PROMETRIUM) 200 MG capsule Take 1 capsule (200 mg total) by mouth daily. 08/02/17  Yes Shambley, Melody N, CNM  topiramate (TOPAMAX) 100 MG tablet Take 1 tablet (100 mg total) by mouth daily. Pt takes 150mg  daily 07/20/17  Yes Shambley, Melody N, CNM  Vitamin D, Ergocalciferol, (DRISDOL) 50000 units CAPS capsule Take 1 capsule (50,000  Units total) by mouth every 7 (seven) days. 10/12/17  Yes Shambley, Melody N, CNM  doxycycline (VIBRAMYCIN) 100 MG capsule Take 1 capsule (100 mg total) by mouth 2 (two) times daily. 11/18/17   Lorin Picket, PA-C  mupirocin ointment (BACTROBAN) 2 % Apply 1 application topically 3 (three) times daily. 11/18/17   Lorin Picket, PA-C    Family History Family History  Adopted: Yes  Family history unknown: Yes    Social History Social History   Tobacco Use  . Smoking status: Former Research scientist (life sciences)  . Smokeless tobacco: Never Used  Substance Use Topics  . Alcohol use: Yes    Comment: occasionaly  . Drug use: No     Allergies   Amoxicillin; Tape; and Vicodin [hydrocodone-acetaminophen]   Review of Systems Review of Systems  Constitutional: Positive for activity change. Negative for appetite change, chills, fatigue and fever.  Skin: Positive for color change and wound.  All other systems reviewed and are negative.    Physical Exam Triage  Vital Signs ED Triage Vitals  Enc Vitals Group     BP 11/18/17 0852 (!) 116/92     Pulse Rate 11/18/17 0852 78     Resp 11/18/17 0852 16     Temp 11/18/17 0852 98.2 F (36.8 C)     Temp Source 11/18/17 0852 Oral     SpO2 11/18/17 0852 100 %     Weight 11/18/17 0851 134 lb (60.8 kg)     Height 11/18/17 0851 5' 4.25" (1.632 m)     Head Circumference --      Peak Flow --      Pain Score 11/18/17 0851 7     Pain Loc --      Pain Edu? --      Excl. in [place]? --    No data found.  Updated Vital Signs BP (!) 116/92 (BP Location: Left Arm)   Pulse 78   Temp 98.2 F (36.8 C) (Oral)   Resp 16   Ht 5' 4.25" (1.632 m)   Wt 134 lb (60.8 kg)   SpO2 100%   BMI 22.82 kg/m   Visual Acuity Right Eye Distance:   Left Eye Distance:   Bilateral Distance:    Right Eye Near:   Left Eye Near:    Bilateral Near:     Physical Exam  Constitutional: She is oriented to person, place, and time. She appears well-developed and well-nourished. No distress.  HENT:  Head: Normocephalic.  Eyes: Pupils are equal, round, and reactive to light. Right eye exhibits no discharge. Left eye exhibits no discharge.  Neck: Normal range of motion.  Musculoskeletal: She exhibits edema and tenderness.  Lamination of the left nondominant index finger shows a swelling of the finger from approximately the DIP to the MP joint.  Patient is very reluctant to move the finger which is stiff.  She has a 22mm round erythematous wound on the dorsum of the finger over the possible phalanx at the midportion.  In the center is a small punctate darkened area from previous bite.  No evidence of felon.  Neurological: She is alert and oriented to person, place, and time.  Skin: Skin is warm and dry. She is not diaphoretic. There is erythema.  Psychiatric: She has a normal mood and affect. Her behavior is normal. Thought content normal.  Nursing note and vitals reviewed.      UC Treatments / Results  Labs (all labs ordered are  listed, but only abnormal  results are displayed) Labs Reviewed - No data to display  EKG None  Radiology No results found.  Procedures Procedures (including critical care time)  Medications Ordered in UC Medications - No data to display  Initial Impression / Assessment and Plan / UC Course  I have reviewed the triage vital signs and the nursing notes.  Pertinent labs & imaging results that were available during my care of the patient were reviewed by me and considered in my medical decision making (see chart for details).     Plan: 1. Test/x-ray results and diagnosis reviewed with patient 2. rx as per orders; risks, benefits, potential side effects reviewed with patient 3. Recommend supportive treatment with warm compresses 3-4 times daily for 10 minutes each time.  This was discussed in detail with the patient and her husband.  Afterwards she needs to dry the area and apply Bactroban ointment.  Place her on doxycycline for 5 days.  He was instructed in range of motion exercises for the fingers so that she does not stiffen.  If she is not improving she should follow-up in our clinic or with her primary care physician. 4. F/u prn if symptoms worsen or don't improve  Final Clinical Impressions(s) / UC Diagnoses   Final diagnoses:  Cellulitis of left upper extremity  Insect bite of left hand, initial encounter     Discharge Instructions     Apply warm compresses 3-4 times daily for 10 minutes each time as we discussed.  Dry thoroughly and then apply mupirocin ointment.  Be sure to take all of your oral antibiotics.  Also be sure to perform range of motion exercises of your fingers as was demonstrated to you today.   Do this Several times each hour.    ED Prescriptions    Medication Sig Dispense Auth. Provider   mupirocin ointment (BACTROBAN) 2 % Apply 1 application topically 3 (three) times daily. 22 g Crecencio Mc P, PA-C   doxycycline (VIBRAMYCIN) 100 MG capsule Take 1  capsule (100 mg total) by mouth 2 (two) times daily. 10 capsule Lorin Picket, PA-C     Controlled Substance Prescriptions [place] Controlled Substance Registry consulted? Not Applicable   Lorin Picket, PA-C 11/18/17 1230

## 2017-11-18 NOTE — ED Triage Notes (Signed)
Patient in today c/o left index finger red and swollen x 5 days. Patient states it started with itching and has progressively gotten worse. Patient has tried to squeeze the finger and has only gotten clear fluid and blood out.

## 2017-11-18 NOTE — Discharge Instructions (Addendum)
Apply warm compresses 3-4 times daily for 10 minutes each time as we discussed.  Dry thoroughly and then apply mupirocin ointment.  Be sure to take all of your oral antibiotics.  Also be sure to perform range of motion exercises of your fingers as was demonstrated to you today.   Do this Several times each hour.

## 2017-11-21 ENCOUNTER — Ambulatory Visit
Admission: EM | Admit: 2017-11-21 | Discharge: 2017-11-21 | Disposition: A | Discharge status: Home / Self Care | Payer: Medicaid Other | Attending: Internal Medicine | Admitting: Internal Medicine

## 2017-11-21 ENCOUNTER — Encounter: Payer: Self-pay | Admitting: Gynecology

## 2017-11-21 DIAGNOSIS — J449 Chronic obstructive pulmonary disease, unspecified: Secondary | ICD-10-CM | POA: Insufficient documentation

## 2017-11-21 DIAGNOSIS — E7800 Pure hypercholesterolemia, unspecified: Secondary | ICD-10-CM | POA: Diagnosis not present

## 2017-11-21 DIAGNOSIS — L0291 Cutaneous abscess, unspecified: Secondary | ICD-10-CM

## 2017-11-21 DIAGNOSIS — M7989 Other specified soft tissue disorders: Secondary | ICD-10-CM | POA: Diagnosis not present

## 2017-11-21 DIAGNOSIS — L03012 Cellulitis of left finger: Secondary | ICD-10-CM | POA: Diagnosis not present

## 2017-11-21 DIAGNOSIS — Z885 Allergy status to narcotic agent status: Secondary | ICD-10-CM | POA: Diagnosis not present

## 2017-11-21 DIAGNOSIS — R110 Nausea: Secondary | ICD-10-CM | POA: Insufficient documentation

## 2017-11-21 DIAGNOSIS — S61201A Unspecified open wound of left index finger without damage to nail, initial encounter: Secondary | ICD-10-CM | POA: Diagnosis present

## 2017-11-21 DIAGNOSIS — M79645 Pain in left finger(s): Secondary | ICD-10-CM | POA: Diagnosis not present

## 2017-11-21 DIAGNOSIS — Z87891 Personal history of nicotine dependence: Secondary | ICD-10-CM | POA: Diagnosis not present

## 2017-11-21 DIAGNOSIS — E559 Vitamin D deficiency, unspecified: Secondary | ICD-10-CM | POA: Insufficient documentation

## 2017-11-21 DIAGNOSIS — I471 Supraventricular tachycardia: Secondary | ICD-10-CM | POA: Diagnosis not present

## 2017-11-21 DIAGNOSIS — Z880 Allergy status to penicillin: Secondary | ICD-10-CM | POA: Diagnosis not present

## 2017-11-21 DIAGNOSIS — Z888 Allergy status to other drugs, medicaments and biological substances status: Secondary | ICD-10-CM | POA: Insufficient documentation

## 2017-11-21 DIAGNOSIS — Z79899 Other long term (current) drug therapy: Secondary | ICD-10-CM | POA: Insufficient documentation

## 2017-11-21 DIAGNOSIS — Z87442 Personal history of urinary calculi: Secondary | ICD-10-CM | POA: Insufficient documentation

## 2017-11-21 DIAGNOSIS — L02512 Cutaneous abscess of left hand: Secondary | ICD-10-CM | POA: Diagnosis not present

## 2017-11-21 LAB — CBC
HCT: 41.5 % (ref 35.0–47.0)
Hemoglobin: 13.9 g/dL (ref 12.0–16.0)
MCH: 32 pg (ref 26.0–34.0)
MCHC: 33.5 g/dL (ref 32.0–36.0)
MCV: 95.6 fL (ref 80.0–100.0)
Platelets: 254 10*3/uL (ref 150–440)
RBC: 4.35 MIL/uL (ref 3.80–5.20)
RDW: 12.2 % (ref 11.5–14.5)
WBC: 3.8 10*3/uL (ref 3.6–11.0)

## 2017-11-21 MED ORDER — LIDOCAINE HCL (PF) 1 % IJ SOLN
2.0000 mL | Freq: Once | INTRAMUSCULAR | Status: AC
  Administered 2017-11-21: 2 mL via INTRADERMAL

## 2017-11-21 MED ORDER — CEFTRIAXONE SODIUM 250 MG IJ SOLR
250.0000 mg | Freq: Once | INTRAMUSCULAR | Status: AC
  Administered 2017-11-21: 250 mg via INTRAMUSCULAR

## 2017-11-21 NOTE — ED Triage Notes (Signed)
Per patient left index finger not getter any better. Pt. Stated drainage from finger.

## 2017-11-21 NOTE — ED Provider Notes (Addendum)
[facility]    CSN: [id number] Arrival date & time: 11/21/17  7341     History   Chief Complaint Chief Complaint  Patient presents with  . Wound Check    HPI Maria Silva is a 32 y.o. female.   Patient seenat urgent care 3 days ago for small wound on left second digit that appeared infected.  Prescribed doxycycline but had more swelling, erythema, subjective fever and chills.  She has also had more nausea than usual.  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     Past Medical History:  Diagnosis Date  . Asthma   . Benign tumor of cervix   . COPD (chronic obstructive pulmonary disease) ([place])   . Kidney stone   . Migraine   . Ovarian tumor   . Sternal fracture   . SVT (supraventricular tachycardia) [facility])     Patient Active Problem List   Diagnosis Date Noted  . Vitamin D deficiency 10/12/2017  . Elevated cholesterol 10/12/2017    Past Surgical History:  Procedure Laterality Date  . ABDOMINAL HYSTERECTOMY    . APPENDECTOMY    . BLADDER REPAIR    . BLADDER REPAIR W/ CESAREAN SECTION    . bladder stent    . Laparascopic    . MINOR HEMORRHOIDECTOMY    . TUMOR REMOVAL      OB History    Gravida  3   Para      Term      Preterm      AB      Living  3     SAB      TAB      Ectopic      Multiple      Live Births  3            Home Medications    Prior to Admission medications   Medication Sig Start Date End Date Taking? Authorizing Provider  budesonide-formoterol (SYMBICORT) 160-4.5 MCG/ACT inhaler Inhale 2 puffs into the lungs 2 (two) times daily. 10/07/17  Yes Shambley, Melody N, CNM  buPROPion (WELLBUTRIN XL) 300 MG 24 hr tablet Take 1 tablet (300 mg total) by mouth daily. 12/04/16  Yes Shambley, Melody N, CNM  doxycycline (VIBRAMYCIN) 100 MG capsule Take 1 capsule (100 mg total) by mouth 2 (two) times daily. 11/18/17  Yes Lorin Picket, PA-C  estradiol (ESTRACE) 2 MG tablet TAKE 1  TABLET BY MOUTH ONCE DAILY 10/25/17  Yes Shambley, Melody N, CNM  mupirocin ointment (BACTROBAN) 2 % Apply 1 application topically 3 (three) times daily. 11/18/17  Yes Crecencio Mc P, PA-C  ondansetron (ZOFRAN ODT) 4 MG disintegrating tablet Take 1 tablet (4 mg total) by mouth every 8 (eight) hours as needed for nausea or vomiting. 10/21/17  Yes Earleen Newport, MD  oxyCODONE-acetaminophen (PERCOCET) 5-325 MG tablet Take 1-2 tablets by mouth every 8 (eight) hours as needed. 10/21/17  Yes Earleen Newport, MD  progesterone (PROMETRIUM) 200 MG capsule Take 1 capsule (200 mg total) by mouth daily. 08/02/17  Yes Shambley, Melody N, CNM  Vitamin D, Ergocalciferol, (DRISDOL) 50000 units CAPS capsule Take 1 capsule (50,000 Units total) by mouth every 7 (seven) days. 10/12/17  Yes Shambley, Melody N, CNM  topiramate (TOPAMAX) 100 MG tablet Take 1 tablet (100 mg total) by mouth daily. Pt takes 150mg  daily 07/20/17   Joylene Igo, CNM    Family History Family History  Adopted: Yes  Family history  unknown: Yes    Social History Social History   Tobacco Use  . Smoking status: Former Research scientist (life sciences)  . Smokeless tobacco: Never Used  Substance Use Topics  . Alcohol use: Yes    Comment: occasionaly  . Drug use: No     Allergies   Amoxicillin; Tape; and Vicodin [hydrocodone-acetaminophen]   Review of Systems Review of Systems  Constitutional: Positive for chills and fever (Subjective).  HENT: Negative for sore throat and tinnitus.   Eyes: Negative for redness.  Respiratory: Negative for cough and shortness of breath.   Cardiovascular: Negative for chest pain and palpitations.  Gastrointestinal: Positive for nausea and vomiting. Negative for abdominal pain and diarrhea.  Genitourinary: Negative for dysuria, frequency and urgency.  Musculoskeletal: Negative for myalgias.  Skin: Negative for rash.       No lesions  Neurological: Negative for weakness.  Hematological: Does not bruise/bleed  easily.  Psychiatric/Behavioral: Negative for suicidal ideas.     Physical Exam Triage Vital Signs ED Triage Vitals  Enc Vitals Group     BP 11/21/17 0920 106/78     Pulse Rate 11/21/17 0920 74     Resp 11/21/17 0920 16     Temp 11/21/17 0920 98.2 F (36.8 C)     Temp Source 11/21/17 0920 Oral     SpO2 11/21/17 0920 100 %     Weight 11/21/17 0922 134 lb (60.8 kg)     Height --      Head Circumference --      Peak Flow --      Pain Score 11/21/17 0922 7     Pain Loc --      Pain Edu? --      Excl. in [place]? --    No data found.  Updated Vital Signs BP 106/78 (BP Location: Right Arm)   Pulse 74   Temp 98.2 F (36.8 C) (Oral)   Resp 16   Wt 134 lb (60.8 kg)   SpO2 100%   BMI 22.82 kg/m   Visual Acuity Right Eye Distance:   Left Eye Distance:   Bilateral Distance:    Right Eye Near:   Left Eye Near:    Bilateral Near:     Physical Exam  Constitutional: She is oriented to person, place, and time. She appears well-developed and well-nourished. No distress.  HENT:  Head: Normocephalic and atraumatic.  Mouth/Throat: Oropharynx is clear and moist.  Eyes: Pupils are equal, round, and reactive to light. Conjunctivae and EOM are normal. No scleral icterus.  Neck: Normal range of motion. Neck supple. No JVD present. No tracheal deviation present. No thyromegaly present.  Cardiovascular: Normal rate, regular rhythm and normal heart sounds. Exam reveals no gallop and no friction rub.  No murmur heard. Pulmonary/Chest: Effort normal and breath sounds normal.  Abdominal: Soft. Bowel sounds are normal. She exhibits no distension. There is no tenderness.  Musculoskeletal: Normal range of motion. She exhibits no edema.  Lymphadenopathy:    She has no cervical adenopathy.  Neurological: She is alert and oriented to person, place, and time. No cranial nerve deficit.  Skin: Skin is warm and dry.  Cellulitic area with fluctuance and small pus pocket 1 cm in diameter on left second  finger.  No eschar or necrosis.  2 punctate lesions thin erythematous area.  No tracking erythema up the arm although the patient does state that the general area more proximal to the wound is tender  Psychiatric: She has a normal mood and affect.  Her behavior is normal. Judgment and thought content normal.  Nursing note and vitals reviewed.    UC Treatments / Results  Labs (all labs ordered are listed, but only abnormal results are displayed) Labs Reviewed  CULTURE, BLOOD (ROUTINE X 2)  CULTURE, BLOOD (ROUTINE X 2) W REFLEX TO ID PANEL  CBC    EKG None  Radiology No results found.  Procedures Procedures (including critical care time)  Medications Ordered in UC Medications  lidocaine (PF) (XYLOCAINE) 1 % injection 2 mL (2 mLs Intradermal Given 11/21/17 1001)  cefTRIAXone (ROCEPHIN) injection 250 mg (250 mg Intramuscular Given 11/21/17 1032)    Initial Impression / Assessment and Plan / UC Course  I have reviewed the triage vital signs and the nursing notes.  Pertinent labs & imaging results that were available during my care of the patient were reviewed by me and considered in my medical decision making (see chart for details).      Small abscess under finger wound.  I&D performed without complication.  Patient has more movement and finger now that some pus has been expressed.  I am concerned that she has early sepsis but she does not yet meet criteria for septis.  Will obtain CBC and blood culture.  Rocephin IM in clinic.  Observed for possible cross reaction allergy to penicillins.  Will continue oral antibiotics.  Will follow blood cultures for growth and sensitivities.  Patient instructed to go to the emergency department for documented fever or worsening nausea and vomiting.  Final Clinical Impressions(s) / UC Diagnoses   Final diagnoses:  Abscess   Discharge Instructions   None    ED Prescriptions    None     Controlled Substance Prescriptions [place] Controlled  Substance Registry consulted? Not Applicable   Harrie Foreman, MD 11/21/17 1123    Harrie Foreman, MD 11/21/17 [phone]

## 2017-11-22 ENCOUNTER — Encounter: Payer: Self-pay | Admitting: Urology

## 2017-11-22 ENCOUNTER — Other Ambulatory Visit: Payer: Self-pay

## 2017-11-22 ENCOUNTER — Ambulatory Visit: Payer: Medicaid Other | Admitting: Urology

## 2017-11-22 MED ORDER — BUPROPION HCL ER (XL) 300 MG PO TB24: 300.0000 mg | ORAL_TABLET | Freq: Every day | ORAL | 0 refills | Status: DC

## 2017-11-26 LAB — CULTURE, BLOOD (ROUTINE X 2)
CULTURE: NO GROWTH
Culture: NO GROWTH
Special Requests: ADEQUATE
Special Requests: ADEQUATE

## 2018-01-22 ENCOUNTER — Ambulatory Visit
Admission: EM | Admit: 2018-01-22 | Discharge: 2018-01-22 | Disposition: A | Discharge status: Home / Self Care | Payer: Medicaid Other | Attending: Emergency Medicine | Admitting: Emergency Medicine

## 2018-01-22 ENCOUNTER — Encounter: Payer: Self-pay | Admitting: Emergency Medicine

## 2018-01-22 ENCOUNTER — Other Ambulatory Visit: Payer: Self-pay

## 2018-01-22 DIAGNOSIS — K047 Periapical abscess without sinus: Secondary | ICD-10-CM | POA: Diagnosis not present

## 2018-01-22 MED ORDER — CLINDAMYCIN HCL 150 MG PO CAPS: 450.0000 mg | ORAL_CAPSULE | Freq: Three times a day (TID) | ORAL | 0 refills | Status: DC

## 2018-01-22 MED ORDER — TRAMADOL HCL 50 MG PO TABS: 50.0000 mg | ORAL_TABLET | Freq: Four times a day (QID) | ORAL | 0 refills | Status: DC | PRN

## 2018-01-22 NOTE — ED Triage Notes (Signed)
Jaw pain (teeth)  for 3 days. Had crown placed but patient stated she refused root canal.  Patient stated she cannot sleep, cannot lay on right side.

## 2018-01-22 NOTE — Discharge Instructions (Addendum)
Follow up with your dentist on Monday.   Take Medications with food

## 2018-01-22 NOTE — ED Provider Notes (Signed)
[facility]    CSN: [id number] Arrival date & time: 01/22/18  1141     History   Chief Complaint Chief Complaint  Patient presents with  . Facial Swelling    HPI Maria Silva is a 32 y.o. female.   HPI  32 year old female  known to this clinic presents with 3 days of  right rear molar pain with now radiation into her jaw on the right.  Had no fever or chills.  States she had a crown placed approximate 1 month ago and had refused the recommendation of a root canal.  States that she is unable to sleep and was not able to lie on that side due to the pain.  Pain has intensified over the 3-day.  Is allergic to Vicodin and amoxicillin.  Today she is afebrile.  Does report a foul metallic type taste in her mouth.        Past Medical History:  Diagnosis Date  . Asthma   . Benign tumor of cervix   . COPD (chronic obstructive pulmonary disease) ([place])   . Kidney stone   . Migraine   . Ovarian tumor   . Sternal fracture   . SVT (supraventricular tachycardia) [facility])     Patient Active Problem List   Diagnosis Date Noted  . Vitamin D deficiency 10/12/2017  . Elevated cholesterol 10/12/2017    Past Surgical History:  Procedure Laterality Date  . ABDOMINAL HYSTERECTOMY    . APPENDECTOMY    . BLADDER REPAIR    . BLADDER REPAIR W/ CESAREAN SECTION    . bladder stent    . Laparascopic    . MINOR HEMORRHOIDECTOMY    . TUMOR REMOVAL      OB History    Gravida  3   Para      Term      Preterm      AB      Living  3     SAB      TAB      Ectopic      Multiple      Live Births  3            Home Medications    Prior to Admission medications   Medication Sig Start Date End Date Taking? Authorizing Provider  budesonide-formoterol (SYMBICORT) 160-4.5 MCG/ACT inhaler Inhale 2 puffs into the lungs 2 (two) times daily. 10/07/17  Yes Shambley, Melody N, CNM  buPROPion (WELLBUTRIN XL) 300 MG 24 hr tablet Take 1 tablet (300 mg total) by  mouth daily. 11/22/17  Yes Shambley, Melody N, CNM  estradiol (ESTRACE) 2 MG tablet TAKE 1 TABLET BY MOUTH ONCE DAILY 10/25/17  Yes Shambley, Melody N, CNM  progesterone (PROMETRIUM) 200 MG capsule Take 1 capsule (200 mg total) by mouth daily. 08/02/17  Yes Shambley, Melody N, CNM  topiramate (TOPAMAX) 100 MG tablet Take 1 tablet (100 mg total) by mouth daily. Pt takes 150mg  daily 07/20/17  Yes Shambley, Melody N, CNM  Vitamin D, Ergocalciferol, (DRISDOL) 50000 units CAPS capsule Take 1 capsule (50,000 Units total) by mouth every 7 (seven) days. 10/12/17  Yes Shambley, Melody N, CNM  clindamycin (CLEOCIN) 150 MG capsule Take 3 capsules (450 mg total) by mouth 3 (three) times daily. 01/22/18   Lorin Picket, PA-C  doxycycline (VIBRAMYCIN) 100 MG capsule Take 1 capsule (100 mg total) by mouth 2 (two) times daily. 11/18/17   Lorin Picket, PA-C  mupirocin ointment (BACTROBAN) 2 % Apply 1  application topically 3 (three) times daily. 11/18/17   Lorin Picket, PA-C  ondansetron (ZOFRAN ODT) 4 MG disintegrating tablet Take 1 tablet (4 mg total) by mouth every 8 (eight) hours as needed for nausea or vomiting. 10/21/17   Earleen Newport, MD  oxyCODONE-acetaminophen (PERCOCET) 5-325 MG tablet Take 1-2 tablets by mouth every 8 (eight) hours as needed. 10/21/17   Earleen Newport, MD  traMADol (ULTRAM) 50 MG tablet Take 1 tablet (50 mg total) by mouth every 6 (six) hours as needed. 01/22/18   Lorin Picket, PA-C    Family History Family History  Adopted: Yes  Family history unknown: Yes    Social History Social History   Tobacco Use  . Smoking status: Former Research scientist (life sciences)  . Smokeless tobacco: Never Used  Substance Use Topics  . Alcohol use: Yes    Comment: occasionaly  . Drug use: No     Allergies   Amoxicillin; Tape; and Vicodin [hydrocodone-acetaminophen]   Review of Systems Review of Systems  Constitutional: Positive for activity change and appetite change. Negative for chills,  fatigue and fever.  HENT: Positive for dental problem.   All other systems reviewed and are negative.    Physical Exam Triage Vital Signs ED Triage Vitals  Enc Vitals Group     BP 01/22/18 1157 109/86     Pulse Rate 01/22/18 1157 65     Resp 01/22/18 1157 16     Temp 01/22/18 1157 97.7 F (36.5 C)     Temp Source 01/22/18 1157 Oral     SpO2 01/22/18 1157 (!) 84 %     Weight 01/22/18 1156 130 lb (59 kg)     Height 01/22/18 1156 5\' 4"  (1.626 m)     Head Circumference --      Peak Flow --      Pain Score 01/22/18 1156 9     Pain Loc --      Pain Edu? --      Excl. in [place]? --    No data found.  Updated Vital Signs BP 109/86 (BP Location: Right Arm)   Pulse 65   Temp 97.7 F (36.5 C) (Oral)   Resp 16   Ht 5\' 4"  (1.626 m)   Wt 130 lb (59 kg)   SpO2 100%   BMI 22.31 kg/m   Visual Acuity Right Eye Distance:   Left Eye Distance:   Bilateral Distance:    Right Eye Near:   Left Eye Near:    Bilateral Near:     Physical Exam  Constitutional: She is oriented to person, place, and time. She appears well-developed and well-nourished. No distress.  HENT:  Head: Normocephalic.  Right Ear: External ear normal.  Left Ear: External ear normal.  Nose: Nose normal.  Mouth/Throat: Oropharynx is clear and moist. No oropharyngeal exudate.  Exam of the oropharynx shows mild swelling of the right cheek in comparison to the left.  Previously placed crown on the rear molar on the upper right shows some swelling but no obvious fluctuance.  She is tender along the gumline both in the mandible and the maxilla on the right both lingual and buccal surfaces.  He has no cervical adenopathy.  Eyes: Pupils are equal, round, and reactive to light. Right eye exhibits no discharge. Left eye exhibits no discharge.  Neck: Normal range of motion.  Musculoskeletal: Normal range of motion.  Lymphadenopathy:    She has no cervical adenopathy.  Neurological: She is alert and oriented  to person, place,  and time.  Skin: Skin is warm and dry. She is not diaphoretic.  Psychiatric: She has a normal mood and affect. Her behavior is normal. Judgment and thought content normal.  Nursing note and vitals reviewed.    UC Treatments / Results  Labs (all labs ordered are listed, but only abnormal results are displayed) Labs Reviewed - No data to display  EKG None  Radiology No results found.  Procedures Procedures (including critical care time)  Medications Ordered in UC Medications - No data to display  Initial Impression / Assessment and Plan / UC Course  I have reviewed the triage vital signs and the nursing notes.  Pertinent labs & imaging results that were available during my care of the patient were reviewed by me and considered in my medical decision making (see chart for details).     Plan: 1. Test/x-ray results and diagnosis reviewed with patient 2. rx as per orders; risks, benefits, potential side effects reviewed with patient 3. Recommend supportive treatment with starting on clindamycin for antibiotic and will prescribe tramadol for pain control along with ibuprofen 400 mg.  Continue with salt water rinses and have advised her to use cold compresses externally for comfort.  She has an appointment to visit her dentist on Monday.  He was cautioned to take her medications with food. 4. F/u prn if symptoms worsen or don't improve  Final Clinical Impressions(s) / UC Diagnoses   Final diagnoses:  Dental infection     Discharge Instructions     Follow up with your dentist on Monday.   Take Medications with food    ED Prescriptions    Medication Sig Dispense Auth. Provider   clindamycin (CLEOCIN) 150 MG capsule Take 3 capsules (450 mg total) by mouth 3 (three) times daily. 63 capsule Crecencio Mc P, PA-C   traMADol (ULTRAM) 50 MG tablet Take 1 tablet (50 mg total) by mouth every 6 (six) hours as needed. 20 tablet Lorin Picket, PA-C     Controlled Substance  Prescriptions [place] Controlled Substance Registry consulted? Not Applicable   Lorin Picket, PA-C 01/22/18 1338

## 2018-02-17 ENCOUNTER — Other Ambulatory Visit: Payer: Self-pay | Admitting: Obstetrics and Gynecology

## 2018-02-18 ENCOUNTER — Other Ambulatory Visit: Payer: Self-pay | Admitting: Obstetrics and Gynecology

## 2018-04-26 ENCOUNTER — Other Ambulatory Visit: Payer: Self-pay

## 2018-04-26 ENCOUNTER — Ambulatory Visit
Admission: EM | Admit: 2018-04-26 | Discharge: 2018-04-26 | Disposition: A | Discharge status: Home / Self Care | Payer: Medicaid Other | Attending: Family Medicine | Admitting: Family Medicine

## 2018-04-26 ENCOUNTER — Encounter: Payer: Self-pay | Admitting: Emergency Medicine

## 2018-04-26 DIAGNOSIS — K529 Noninfective gastroenteritis and colitis, unspecified: Secondary | ICD-10-CM

## 2018-04-26 MED ORDER — DIPHENOXYLATE-ATROPINE 2.5-0.025 MG PO TABS: 1.0000 | ORAL_TABLET | Freq: Four times a day (QID) | ORAL | 0 refills | Status: DC | PRN

## 2018-04-26 MED ORDER — ONDANSETRON 4 MG PO TBDP: 4.0000 mg | ORAL_TABLET | Freq: Three times a day (TID) | ORAL | 0 refills | Status: DC | PRN

## 2018-04-26 MED ORDER — DICYCLOMINE HCL 20 MG PO TABS: 20.0000 mg | ORAL_TABLET | Freq: Four times a day (QID) | ORAL | 0 refills | Status: DC | PRN

## 2018-04-26 NOTE — ED Triage Notes (Signed)
Patient c/o lower mid abdominal pain that started 3 days ago.  Patient also reports diarrhea that has been going on.

## 2018-04-26 NOTE — Discharge Instructions (Addendum)
Rest, fluids.  Medications as prescribed.  Take care  Dr. Lacinda Axon

## 2018-04-26 NOTE — ED Provider Notes (Signed)
[facility]    CSN: [id number] Arrival date & time: 04/26/18  1057  History   Chief Complaint Chief Complaint  Patient presents with  . Abdominal Pain  . Diarrhea   HPI  32 year old female presents with nausea, vomiting, diarrhea, and associated abdominal pain.  3 days ago.  Patient reports nausea, vomiting, diarrhea.  Nausea vomiting has improved with use of Zofran.  Diarrhea has persisted.  Watery.  Little to no form.  Associated crampy abdominal pain.  No fever.  No chills.  No known exacerbating factors.  No other associated symptoms.  No other complaints.  Past Medical History:  Diagnosis Date  . Asthma   . Benign tumor of cervix   . COPD (chronic obstructive pulmonary disease) ([place])   . Kidney stone   . Migraine   . Ovarian tumor   . Sternal fracture   . SVT (supraventricular tachycardia) [facility])     Patient Active Problem List   Diagnosis Date Noted  . Vitamin D deficiency 10/12/2017  . Elevated cholesterol 10/12/2017    Past Surgical History:  Procedure Laterality Date  . ABDOMINAL HYSTERECTOMY    . APPENDECTOMY    . BLADDER REPAIR    . BLADDER REPAIR W/ CESAREAN SECTION    . bladder stent    . Laparascopic    . MINOR HEMORRHOIDECTOMY    . TUMOR REMOVAL      OB History    Gravida  3   Para      Term      Preterm      AB      Living  3     SAB      TAB      Ectopic      Multiple      Live Births  3            Home Medications    Prior to Admission medications   Medication Sig Start Date End Date Taking? Authorizing Provider  budesonide-formoterol (SYMBICORT) 160-4.5 MCG/ACT inhaler Inhale 2 puffs into the lungs 2 (two) times daily. 10/07/17  Yes Shambley, Melody N, CNM  buPROPion (WELLBUTRIN XL) 300 MG 24 hr tablet TAKE 1 TABLET BY MOUTH ONCE DAILY 02/21/18  Yes Shambley, Melody N, CNM  estradiol (ESTRACE) 2 MG tablet TAKE 1 TABLET BY MOUTH ONCE DAILY 10/25/17  Yes Shambley, Melody N, CNM  progesterone (PROMETRIUM)  200 MG capsule TAKE 1 CAPSULE BY MOUTH ONCE DAILY 02/21/18  Yes Shambley, Melody N, CNM  topiramate (TOPAMAX) 100 MG tablet Take 1 tablet (100 mg total) by mouth daily. Pt takes 150mg  daily 07/20/17  Yes Shambley, Melody N, CNM  Vitamin D, Ergocalciferol, (DRISDOL) 50000 units CAPS capsule Take 1 capsule (50,000 Units total) by mouth every 7 (seven) days. 10/12/17  Yes Shambley, Melody N, CNM  dicyclomine (BENTYL) 20 MG tablet Take 1 tablet (20 mg total) by mouth 4 (four) times daily as needed for spasms. 04/26/18   Coral Spikes, DO  diphenoxylate-atropine (LOMOTIL) 2.5-0.025 MG tablet Take 1 tablet by mouth 4 (four) times daily as needed for diarrhea or loose stools. 04/26/18   Coral Spikes, DO  ondansetron (ZOFRAN-ODT) 4 MG disintegrating tablet Take 1 tablet (4 mg total) by mouth every 8 (eight) hours as needed for nausea or vomiting. 04/26/18   Coral Spikes, DO    Family History Family History  Adopted: Yes  Family history unknown: Yes    Social History Social History   Tobacco Use  .  Smoking status: Former Research scientist (life sciences)  . Smokeless tobacco: Never Used  Substance Use Topics  . Alcohol use: Yes    Comment: occasionaly  . Drug use: No     Allergies   Amoxicillin; Tape; and Vicodin [hydrocodone-acetaminophen]   Review of Systems Review of Systems  Constitutional: Negative for fever.  Gastrointestinal: Positive for abdominal pain, diarrhea, nausea and vomiting.   Physical Exam Triage Vital Signs ED Triage Vitals  Enc Vitals Group     BP 04/26/18 1123 105/84     Pulse Rate 04/26/18 1123 78     Resp 04/26/18 1123 14     Temp 04/26/18 1123 98.4 F (36.9 C)     Temp Source 04/26/18 1123 Oral     SpO2 04/26/18 1123 100 %     Weight 04/26/18 1119 128 lb (58.1 kg)     Height 04/26/18 1119 5\' 4"  (1.626 m)     Head Circumference --      Peak Flow --      Pain Score 04/26/18 1119 8     Pain Loc --      Pain Edu? --      Excl. in [place]? --    Updated Vital Signs BP 105/84 (BP  Location: Left Arm)   Pulse 78   Temp 98.4 F (36.9 C) (Oral)   Resp 14   Ht 5\' 4"  (1.626 m)   Wt 58.1 kg   SpO2 100%   BMI 21.97 kg/m   Visual Acuity Right Eye Distance:   Left Eye Distance:   Bilateral Distance:    Right Eye Near:   Left Eye Near:    Bilateral Near:     Physical Exam  Constitutional: She is oriented to person, place, and time. She appears well-developed. No distress.  HENT:  Head: Normocephalic and atraumatic.  Cardiovascular: Normal rate and regular rhythm.  Pulmonary/Chest: Effort normal and breath sounds normal. She has no wheezes. She has no rales.  Abdominal: Soft. She exhibits no distension. There is no tenderness.  Neurological: She is alert and oriented to person, place, and time.  Psychiatric: She has a normal mood and affect. Her behavior is normal.  Nursing note and vitals reviewed.  UC Treatments / Results  Labs (all labs ordered are listed, but only abnormal results are displayed) Labs Reviewed - No data to display  EKG None  Radiology No results found.  Procedures Procedures (including critical care time)  Medications Ordered in UC Medications - No data to display  Initial Impression / Assessment and Plan / UC Course  I have reviewed the triage vital signs and the nursing notes.  Pertinent labs & imaging results that were available during my care of the patient were reviewed by me and considered in my medical decision making (see chart for details).    32 year old female presents with gastroenteritis.  Treating with Lomotil, dicyclomine, Zofran as needed.  Final Clinical Impressions(s) / UC Diagnoses   Final diagnoses:  Gastroenteritis     Discharge Instructions     Rest, fluids.  Medications as prescribed.  Take care  Dr. Lacinda Axon    ED Prescriptions    Medication Sig Dispense Auth. Provider   diphenoxylate-atropine (LOMOTIL) 2.5-0.025 MG tablet Take 1 tablet by mouth 4 (four) times daily as needed for diarrhea  or loose stools. 30 tablet Riyad Keena G, DO   dicyclomine (BENTYL) 20 MG tablet Take 1 tablet (20 mg total) by mouth 4 (four) times daily as needed for spasms. 20 tablet  Lavender Stanke G, DO   ondansetron (ZOFRAN-ODT) 4 MG disintegrating tablet Take 1 tablet (4 mg total) by mouth every 8 (eight) hours as needed for nausea or vomiting. 20 tablet Coral Spikes, DO     Controlled Substance Prescriptions [place] Controlled Substance Registry consulted? Not Applicable   Coral Spikes, DO 04/26/18 1322

## 2018-05-09 ENCOUNTER — Other Ambulatory Visit: Payer: Self-pay

## 2018-05-09 ENCOUNTER — Ambulatory Visit: Payer: Medicaid Other

## 2018-05-09 ENCOUNTER — Encounter: Payer: Self-pay | Admitting: Emergency Medicine

## 2018-05-09 ENCOUNTER — Ambulatory Visit
Admission: EM | Admit: 2018-05-09 | Discharge: 2018-05-09 | Disposition: A | Discharge status: Home / Self Care | Payer: Medicaid Other | Attending: Family Medicine | Admitting: Family Medicine

## 2018-05-09 DIAGNOSIS — Z87891 Personal history of nicotine dependence: Secondary | ICD-10-CM | POA: Insufficient documentation

## 2018-05-09 DIAGNOSIS — Z79899 Other long term (current) drug therapy: Secondary | ICD-10-CM | POA: Diagnosis not present

## 2018-05-09 DIAGNOSIS — Z880 Allergy status to penicillin: Secondary | ICD-10-CM | POA: Diagnosis not present

## 2018-05-09 DIAGNOSIS — Z888 Allergy status to other drugs, medicaments and biological substances status: Secondary | ICD-10-CM | POA: Diagnosis not present

## 2018-05-09 DIAGNOSIS — A084 Viral intestinal infection, unspecified: Secondary | ICD-10-CM | POA: Insufficient documentation

## 2018-05-09 DIAGNOSIS — Z885 Allergy status to narcotic agent status: Secondary | ICD-10-CM | POA: Insufficient documentation

## 2018-05-09 DIAGNOSIS — M545 Low back pain: Secondary | ICD-10-CM | POA: Diagnosis present

## 2018-05-09 DIAGNOSIS — R112 Nausea with vomiting, unspecified: Secondary | ICD-10-CM | POA: Diagnosis present

## 2018-05-09 LAB — URINALYSIS, COMPLETE (UACMP) WITH MICROSCOPIC
BACTERIA UA: NONE SEEN
Bilirubin Urine: NEGATIVE
Glucose, UA: NEGATIVE mg/dL
Ketones, ur: 80 mg/dL — AB
Leukocytes, UA: NEGATIVE
Nitrite: NEGATIVE
PROTEIN: NEGATIVE mg/dL
RBC / HPF: NONE SEEN RBC/hpf (ref 0–5)
Specific Gravity, Urine: 1.025 (ref 1.005–1.030)
pH: 5.5 (ref 5.0–8.0)

## 2018-05-09 LAB — COMPREHENSIVE METABOLIC PANEL
ALT: 17 U/L (ref 0–44)
AST: 16 U/L (ref 15–41)
Albumin: 4 g/dL (ref 3.5–5.0)
Alkaline Phosphatase: 45 U/L (ref 38–126)
Anion gap: 8 (ref 5–15)
BUN: 9 mg/dL (ref 6–20)
CHLORIDE: 109 mmol/L (ref 98–111)
CO2: 23 mmol/L (ref 22–32)
CREATININE: 0.57 mg/dL (ref 0.44–1.00)
Calcium: 9 mg/dL (ref 8.9–10.3)
GFR calc Af Amer: >60 mL/min (ref >60)
GFR calc non Af Amer: >60 mL/min (ref >60)
GLUCOSE: 92 mg/dL (ref 70–99)
Potassium: 3.3 mmol/L — ABNORMAL LOW (ref 3.5–5.1)
SODIUM: 140 mmol/L (ref 135–145)
Total Bilirubin: 0.7 mg/dL (ref 0.3–1.2)
Total Protein: 7.3 g/dL (ref 6.5–8.1)

## 2018-05-09 LAB — CBC WITH DIFFERENTIAL/PLATELET
ABS IMMATURE GRANULOCYTES: 0.01 10*3/uL (ref 0.00–0.07)
Basophils Absolute: 0 10*3/uL (ref 0.0–0.1)
Basophils Relative: 1 %
Eosinophils Absolute: 0 10*3/uL (ref 0.0–0.5)
Eosinophils Relative: 1 %
HEMATOCRIT: 37.4 % (ref 36.0–46.0)
HEMOGLOBIN: 12.8 g/dL (ref 12.0–15.0)
Immature Granulocytes: 0 %
LYMPHS ABS: 1.1 10*3/uL (ref 0.7–4.0)
LYMPHS PCT: 20 %
MCH: 31.8 pg (ref 26.0–34.0)
MCHC: 34.2 g/dL (ref 30.0–36.0)
MCV: 93 fL (ref 80.0–100.0)
MONO ABS: 0.4 10*3/uL (ref 0.1–1.0)
MONOS PCT: 8 %
NEUTROS ABS: 4 10*3/uL (ref 1.7–7.7)
Neutrophils Relative %: 70 %
Platelets: 246 10*3/uL (ref 150–400)
RBC: 4.02 MIL/uL (ref 3.87–5.11)
RDW: 12 % (ref 11.5–15.5)
WBC: 5.6 10*3/uL (ref 4.0–10.5)
nRBC: 0 % (ref 0.0–0.2)

## 2018-05-09 LAB — LIPASE, BLOOD: Lipase: 23 U/L (ref 11–51)

## 2018-05-09 MED ORDER — ONDANSETRON 4 MG PO TBDP
4.0000 mg | ORAL_TABLET | Freq: Once | ORAL | Status: AC
Start: 2018-05-09 — End: 2018-05-09
  Administered 2018-05-09: 4 mg via ORAL

## 2018-05-09 MED ORDER — PROMETHAZINE HCL 25 MG PO TABS: 25.0000 mg | ORAL_TABLET | Freq: Four times a day (QID) | ORAL | 0 refills | Status: DC | PRN

## 2018-05-09 MED ORDER — PROMETHAZINE HCL 25 MG/ML IJ SOLN
25.0000 mg | Freq: Once | INTRAMUSCULAR | Status: AC
  Administered 2018-05-09: 25 mg via INTRAMUSCULAR

## 2018-05-09 NOTE — ED Triage Notes (Signed)
Pt nausea, vomiting, diarrhea, back pain ( "my kidney hurts"), and horseness.Pt reports that 3 weeks ago she had the stomach bug and and the medications she was given made her constipated and she stopped it. 3 days later the nausea, vomiting, diarrhea, abdominal pain.

## 2018-05-09 NOTE — Discharge Instructions (Signed)
Clear liquids then advance diet slowly as tolerated Over the counter Imodium AD

## 2018-05-09 NOTE — ED Provider Notes (Signed)
[facility]    CSN: [id number] Arrival date & time: 05/09/18  1728     History   Chief Complaint Chief Complaint  Patient presents with  . Abdominal Pain  . Back Pain    HPI Maria Silva is a 32 y.o. female.   32 yo female with a c/o nausea, vomiting, and diarrhea along with right sided low back pain for the past week. Patient recently seen about 2 weeks ago for the same and states she improved only mildly and not sure if it ever completely resolved. Denies any fevers, chills, dysuria, hematuria, melena, hematochezia.   The history is provided by the patient.  Abdominal Pain  Back Pain  Associated symptoms: abdominal pain     Past Medical History:  Diagnosis Date  . Asthma   . Benign tumor of cervix   . COPD (chronic obstructive pulmonary disease) ([place])   . Kidney stone   . Migraine   . Ovarian tumor   . Sternal fracture   . SVT (supraventricular tachycardia) [facility])     Patient Active Problem List   Diagnosis Date Noted  . Vitamin D deficiency 10/12/2017  . Elevated cholesterol 10/12/2017    Past Surgical History:  Procedure Laterality Date  . ABDOMINAL HYSTERECTOMY    . APPENDECTOMY    . BLADDER REPAIR    . BLADDER REPAIR W/ CESAREAN SECTION    . bladder stent    . Laparascopic    . MINOR HEMORRHOIDECTOMY    . TUMOR REMOVAL      OB History    Gravida  3   Para      Term      Preterm      AB      Living  3     SAB      TAB      Ectopic      Multiple      Live Births  3            Home Medications    Prior to Admission medications   Medication Sig Start Date End Date Taking? Authorizing Provider  budesonide-formoterol (SYMBICORT) 160-4.5 MCG/ACT inhaler Inhale 2 puffs into the lungs 2 (two) times daily. 10/07/17  Yes Shambley, Melody N, CNM  buPROPion (WELLBUTRIN XL) 300 MG 24 hr tablet TAKE 1 TABLET BY MOUTH ONCE DAILY 02/21/18  Yes Shambley, Melody N, CNM  estradiol (ESTRACE) 2 MG tablet TAKE 1 TABLET BY  MOUTH ONCE DAILY 10/25/17  Yes Shambley, Melody N, CNM  ondansetron (ZOFRAN-ODT) 4 MG disintegrating tablet Take 1 tablet (4 mg total) by mouth every 8 (eight) hours as needed for nausea or vomiting. 04/26/18  Yes Cook, Jayce G, DO  progesterone (PROMETRIUM) 200 MG capsule TAKE 1 CAPSULE BY MOUTH ONCE DAILY 02/21/18  Yes Shambley, Melody N, CNM  topiramate (TOPAMAX) 100 MG tablet Take 1 tablet (100 mg total) by mouth daily. Pt takes 150mg  daily 07/20/17  Yes Shambley, Melody N, CNM  Vitamin D, Ergocalciferol, (DRISDOL) 50000 units CAPS capsule Take 1 capsule (50,000 Units total) by mouth every 7 (seven) days. 10/12/17  Yes Shambley, Melody N, CNM  dicyclomine (BENTYL) 20 MG tablet Take 1 tablet (20 mg total) by mouth 4 (four) times daily as needed for spasms. 04/26/18   Coral Spikes, DO  diphenoxylate-atropine (LOMOTIL) 2.5-0.025 MG tablet Take 1 tablet by mouth 4 (four) times daily as needed for diarrhea or loose stools. 04/26/18   Coral Spikes, DO  promethazine (PHENERGAN)  25 MG tablet Take 1 tablet (25 mg total) by mouth every 6 (six) hours as needed for nausea or vomiting. 05/09/18   Norval Gable, MD    Family History Family History  Adopted: Yes  Family history unknown: Yes    Social History Social History   Tobacco Use  . Smoking status: Former Research scientist (life sciences)  . Smokeless tobacco: Never Used  Substance Use Topics  . Alcohol use: Yes    Comment: occasionaly  . Drug use: No     Allergies   Amoxicillin; Tape; and Vicodin [hydrocodone-acetaminophen]   Review of Systems Review of Systems  Gastrointestinal: Positive for abdominal pain.  Musculoskeletal: Positive for back pain.     Physical Exam Triage Vital Signs ED Triage Vitals  Enc Vitals Group     BP 05/09/18 1826 (!) 122/95     Pulse Rate 05/09/18 1826 76     Resp 05/09/18 1826 18     Temp 05/09/18 1826 97.7 F (36.5 C)     Temp Source 05/09/18 1826 Oral     SpO2 05/09/18 1826 100 %     Weight 05/09/18 1821 127 lb (57.6  kg)     Height 05/09/18 1821 5\' 4"  (1.626 m)     Head Circumference --      Peak Flow --      Pain Score 05/09/18 1820 9     Pain Loc --      Pain Edu? --      Excl. in [place]? --    No data found.  Updated Vital Signs BP (!) 122/95 (BP Location: Left Arm)   Pulse 76   Temp 97.7 F (36.5 C) (Oral)   Resp 18   Ht 5\' 4"  (1.626 m)   Wt 57.6 kg   SpO2 100%   BMI 21.80 kg/m   Visual Acuity Right Eye Distance:   Left Eye Distance:   Bilateral Distance:    Right Eye Near:   Left Eye Near:    Bilateral Near:     Physical Exam  Constitutional: She appears well-developed and well-nourished. No distress.  Pulmonary/Chest: Effort normal. No respiratory distress.  Abdominal: Soft. Bowel sounds are normal. She exhibits no distension and no mass. There is tenderness (mild; right flank). There is no rebound and no guarding. No hernia.  Skin: She is not diaphoretic.  Nursing note and vitals reviewed.    UC Treatments / Results  Labs (all labs ordered are listed, but only abnormal results are displayed) Labs Reviewed  URINALYSIS, COMPLETE (UACMP) WITH MICROSCOPIC - Abnormal; Notable for the following components:      Result Value   Hgb urine dipstick TRACE (*)    Ketones, ur 80 (*)    All other components within normal limits  COMPREHENSIVE METABOLIC PANEL - Abnormal; Notable for the following components:   Potassium 3.3 (*)    All other components within normal limits  LIPASE, BLOOD  CBC WITH DIFFERENTIAL/PLATELET    EKG None  Radiology Dg Abd 2 Views  Result Date: 05/09/2018 CLINICAL DATA:  Abdominal pain. EXAM: ABDOMEN - 2 VIEW COMPARISON:  10/21/17. FINDINGS: The bowel gas pattern is normal. Moderate stool burden identified within the left colon and rectum. There is no evidence of free air. No radio-opaque calculi or other significant radiographic abnormality is seen. IMPRESSION: Nonobstructive bowel gas pattern. Electronically Signed   By: Kerby Moors M.D.   On:  05/09/2018 19:57    Procedures Procedures (including critical care time)  Medications Ordered in  UC Medications  ondansetron (ZOFRAN-ODT) disintegrating tablet 4 mg (4 mg Oral Given 05/09/18 1911)  promethazine (PHENERGAN) injection 25 mg (25 mg Intramuscular Given 05/09/18 2012)    Initial Impression / Assessment and Plan / UC Course  I have reviewed the triage vital signs and the nursing notes.  Pertinent labs & imaging results that were available during my care of the patient were reviewed by me and considered in my medical decision making (see chart for details).      Final Clinical Impressions(s) / UC Diagnoses   Final diagnoses:  Viral gastroenteritis     Discharge Instructions     Clear liquids then advance diet slowly as tolerated Over the counter Imodium AD    ED Prescriptions    Medication Sig Dispense Auth. Provider   promethazine (PHENERGAN) 25 MG tablet Take 1 tablet (25 mg total) by mouth every 6 (six) hours as needed for nausea or vomiting. 10 tablet Norval Gable, MD     1. Labs/x-ray results and diagnosis reviewed with patient; patient given zofran 4 mg odt without relief; given phenergan 25mg  IM x 1 with relief 2. rx as per orders above; reviewed possible side effects, interactions, risks and benefits  3. Recommend supportive treatment with clear liquids then advance diet slowly as tolerated  4. Follow-up prn if symptoms worsen or don't improve   Controlled Substance Prescriptions [place] Controlled Substance Registry consulted? Not Applicable   Norval Gable, MD 05/09/18 2036

## 2018-06-10 ENCOUNTER — Telehealth: Payer: Self-pay | Admitting: Obstetrics and Gynecology

## 2018-06-10 ENCOUNTER — Other Ambulatory Visit: Payer: Self-pay | Admitting: *Deleted

## 2018-06-10 MED ORDER — NITROFURANTOIN MONOHYD MACRO 100 MG PO CAPS: 100.0000 mg | ORAL_CAPSULE | Freq: Every day | ORAL | 6 refills | Status: DC

## 2018-06-10 NOTE — Telephone Encounter (Signed)
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  She states it works prophylactically for her UTIs, please advise, thanks.

## 2018-06-10 NOTE — Telephone Encounter (Signed)
Sent in

## 2018-08-06 ENCOUNTER — Encounter: Payer: Self-pay | Admitting: Emergency Medicine

## 2018-08-06 ENCOUNTER — Emergency Department
Admission: EM | Admit: 2018-08-06 | Discharge: 2018-08-06 | Disposition: A | Discharge status: Home / Self Care | Payer: Medicaid Other | Attending: Emergency Medicine | Admitting: Emergency Medicine

## 2018-08-06 ENCOUNTER — Emergency Department: Payer: Medicaid Other

## 2018-08-06 ENCOUNTER — Other Ambulatory Visit: Payer: Self-pay

## 2018-08-06 DIAGNOSIS — J45909 Unspecified asthma, uncomplicated: Secondary | ICD-10-CM | POA: Insufficient documentation

## 2018-08-06 DIAGNOSIS — S60221A Contusion of right hand, initial encounter: Secondary | ICD-10-CM | POA: Insufficient documentation

## 2018-08-06 DIAGNOSIS — Y92018 Other place in single-family (private) house as the place of occurrence of the external cause: Secondary | ICD-10-CM | POA: Diagnosis not present

## 2018-08-06 DIAGNOSIS — R51 Headache: Secondary | ICD-10-CM | POA: Insufficient documentation

## 2018-08-06 DIAGNOSIS — Z79899 Other long term (current) drug therapy: Secondary | ICD-10-CM | POA: Diagnosis not present

## 2018-08-06 DIAGNOSIS — Z87891 Personal history of nicotine dependence: Secondary | ICD-10-CM | POA: Insufficient documentation

## 2018-08-06 DIAGNOSIS — J449 Chronic obstructive pulmonary disease, unspecified: Secondary | ICD-10-CM | POA: Diagnosis not present

## 2018-08-06 DIAGNOSIS — Y998 Other external cause status: Secondary | ICD-10-CM | POA: Diagnosis not present

## 2018-08-06 DIAGNOSIS — S1093XA Contusion of unspecified part of neck, initial encounter: Secondary | ICD-10-CM | POA: Diagnosis not present

## 2018-08-06 DIAGNOSIS — S6991XA Unspecified injury of right wrist, hand and finger(s), initial encounter: Secondary | ICD-10-CM | POA: Diagnosis present

## 2018-08-06 DIAGNOSIS — Y9389 Activity, other specified: Secondary | ICD-10-CM | POA: Diagnosis not present

## 2018-08-06 MED ORDER — ACETAMINOPHEN 500 MG PO TABS
1000.0000 mg | ORAL_TABLET | Freq: Once | ORAL | Status: AC
  Administered 2018-08-06: 1000 mg via ORAL
  Filled 2018-08-06: qty 2

## 2018-08-06 MED ORDER — IBUPROFEN 400 MG PO TABS
400.0000 mg | ORAL_TABLET | Freq: Once | ORAL | Status: AC
  Administered 2018-08-06: 400 mg via ORAL
  Filled 2018-08-06: qty 1

## 2018-08-06 NOTE — ED Notes (Signed)
Pt taken to CT via wheelchair. Children given drinks.

## 2018-08-06 NOTE — ED Notes (Addendum)
Pt here with 3 kids.

## 2018-08-06 NOTE — ED Notes (Addendum)
SANE RN at bedside. Will take pt to family consult waiting room to assess pt. Children will stay in room 2 to watch tv.
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
## 2018-08-06 NOTE — ED Notes (Signed)
[employer] will talk to pt pt in family consult waiting room after pt scans are finished. Updated CT to let xray know and inform pt of plan of care. Pt appeared to not want to talk in front of her children. Children in room, within eye shot of nurses station, watching tv at this time.

## 2018-08-06 NOTE — ED Notes (Signed)
Pt returned to room by this RN. SANE RN entered ED at this time. Informed SANE RN about pt.

## 2018-08-06 NOTE — Consult Note (Signed)
Charge RN notified FNE of patient's arrival. States the patient is not medically cleared, has not seen a physician yet and is presently talking with [employer]. Notified RN that I would be there in approximately one hour and to call me back if she anticipates patient scans and discussion with LE will conclude prior to one hour from now.

## 2018-08-06 NOTE — ED Notes (Signed)
MD at bedside. c-collar placed by triage RN. Removed by MD
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
## 2018-08-06 NOTE — Consult Note (Signed)
The SANE/FNE (Forensic Nurse Examiner) consult has been completed. The primary RN has been notified. Please contact the SANE/FNE nurse on call (listed in Amion) with any further concerns.  

## 2018-08-06 NOTE — ED Notes (Signed)
SANE RN stated she gave pt information for places/people in the community that she could reach out to. Pt ok for DC at this time.

## 2018-08-06 NOTE — ED Notes (Signed)
Oncologist.

## 2018-08-06 NOTE — ED Notes (Addendum)
Children given ice cream. Pt remains in family wait talking to [employer].   Per ED MD, pt is medically cleared at this time. Awaiting SANE RN

## 2018-08-06 NOTE — ED Notes (Signed)
Pt denied wanting an ice pack for R hand. Swelling and bruising noted to R hand. Bruising noted to L wrist. Pt c/o of L sided neck pain that radiates toward posterior head. Also c/o pain to L forehead. No bruising noted at these sights. A&O, talking quietly. Denies CP or leg pain. States abd is sore.

## 2018-08-08 ENCOUNTER — Telehealth: Payer: Self-pay | Admitting: Obstetrics and Gynecology

## 2018-08-08 NOTE — Telephone Encounter (Signed)
The patient states she was told she could contact her provider to get an increase on her Wellbutrin, but she is not sure if she needs to come in and be seen or if she can get that called in to her pharmacy, please advise, thanks.

## 2018-08-08 NOTE — Telephone Encounter (Signed)
pls advise

## 2018-08-09 NOTE — Telephone Encounter (Signed)
Have her come in to discuss with me

## 2018-08-09 NOTE — Telephone Encounter (Signed)
Called pt appt made for 08/12/18

## 2018-08-11 ENCOUNTER — Other Ambulatory Visit: Payer: Self-pay | Admitting: *Deleted

## 2018-08-11 MED ORDER — TOPIRAMATE 100 MG PO TABS: 100.0000 mg | ORAL_TABLET | Freq: Every day | ORAL | 4 refills | Status: DC

## 2018-08-12 ENCOUNTER — Encounter: Payer: Self-pay | Admitting: Obstetrics and Gynecology

## 2018-08-12 ENCOUNTER — Ambulatory Visit (INDEPENDENT_AMBULATORY_CARE_PROVIDER_SITE_OTHER): Payer: Medicaid Other | Admitting: Obstetrics and Gynecology

## 2018-08-12 ENCOUNTER — Telehealth: Payer: Self-pay | Admitting: Obstetrics and Gynecology

## 2018-08-12 VITALS — BP 109/84 | HR 69 | Ht 64.5 in | Wt 120.0 lb

## 2018-08-12 DIAGNOSIS — F329 Major depressive disorder, single episode, unspecified: Secondary | ICD-10-CM

## 2018-08-12 DIAGNOSIS — E876 Hypokalemia: Secondary | ICD-10-CM

## 2018-08-12 DIAGNOSIS — Z6981 Encounter for mental health services for victim of other abuse: Secondary | ICD-10-CM

## 2018-08-12 DIAGNOSIS — Z79899 Other long term (current) drug therapy: Secondary | ICD-10-CM | POA: Diagnosis not present

## 2018-08-12 MED ORDER — BUPROPION HCL ER (XL) 450 MG PO TB24: 300.0000 mg | ORAL_TABLET | Freq: Every day | ORAL | 6 refills | Status: DC

## 2018-08-12 MED ORDER — POTASSIUM CHLORIDE ER 10 MEQ PO TBCR: 10.0000 meq | EXTENDED_RELEASE_TABLET | Freq: Every day | ORAL | 1 refills | Status: DC

## 2018-08-12 NOTE — Patient Instructions (Signed)
Potassium Content of Foods    Potassium is a mineral found in many foods and drinks. It affects how the heart works, and helps keep fluids and minerals balanced in the body.  The amount of potassium you need each day depends on your age and any medical conditions you may have. Talk to your health care provider or dietitian about how much potassium you need.  The following lists of foods provide the general serving size for foods and the approximate amount of potassium in each serving, listed in milligrams (mg). Actual values may vary depending on the product and how it is processed.  High in potassium  The following foods and beverages have 200 mg or more of potassium per serving:  · Apricots (raw) - 2 have 200 mg of potassium.  · Apricots (dry) - 5 have 200 mg of potassium.  · Artichoke - 1 medium has 345 mg of potassium.  · Avocado - ¼ fruit has 245 mg of potassium.  · Banana - 1 medium fruit has 425 mg of potassium.  · Lima or baked beans (canned) - ½ cup has 280 mg of potassium.  · White beans (canned) - ½ cup has 595 mg potassium.  · Beef roast - 3 oz has 320 mg of potassium.  · Ground beef - 3 oz has 270 mg of potassium.  · Beets (raw or cooked) - ½ cup has 260 mg of potassium.  · Bran muffin - 2 oz has 300 mg of potassium.  · Broccoli (cooked) - ½ cup has 230 mg of potassium.  · Brussels sprouts - ½ cup has 250 mg of potassium.  · Cantaloupe - ½ cup has 215 mg of potassium.  · Cereal, 100% bran - ½ cup has 200-400 mg of potassium.  · Cheeseburger -1 single fast food burger has 225-400 mg of potassium.  · Chicken - 3 oz has 220 mg of potassium.  · Clams (canned) - 3 oz has 535 mg of potassium.  · Crab - 3 oz has 225 mg of potassium.  · Dates - 5 have 270 mg of potassium.  · Dried beans and peas - ½ cup has 300-475 mg of potassium.  · Figs (dried) - 2 have 260 mg of potassium.  · Fish (halibut, tuna, cod, snapper) - 3 oz has 480 mg of potassium.  · Fish (salmon, haddock, swordfish, perch) - 3 oz has 300 mg of  potassium.  · Fish (tuna, canned) - 3 oz has 200 mg of potassium.  · French fries (fast food) - 3 oz has 470 mg of potassium.  · Granola with fruit and nuts - ½ cup has 200 mg of potassium.  · Grapefruit juice - ½ cup has 200 mg of potassium.  · Honeydew melon - ½ cup has 200 mg of potassium.  · Kale (raw) - 1 cup has 300 mg of potassium.  · Kiwi - 1 medium fruit has 240 mg of potassium.  · Kohlrabi, rutabaga, parsnips - ½ cup has 280 mg of potassium.  · Lentils - ½ cup has 365 mg of potassium.  · Mango - 1 each has 325 mg of potassium.  · Milk (nonfat, low-fat, whole, buttermilk) - 1 cup has 350-380 mg of potassium.  · Milk (chocolate) - 1 cup has 420 mg of potassium  · Molasses - 1 Tbsp has 295 mg of potassium.  · Mushrooms - ½ cup has 280 mg of potassium.  · Nectarine - 1 each has   275 mg of potassium.  · Nuts (almonds, peanuts, hazelnuts, [place], cashew, mixed) - 1 oz has 200 mg of potassium.  · Nuts (pistachios) - 1 oz has 295 mg of potassium.  · Orange - 1 fruit has 240 mg of potassium.  · Orange juice - ½ cup has 235 mg of potassium.  · Papaya - ½ medium fruit has 390 mg of potassium.  · Peanut butter (chunky) - 2 Tbsp has 240 mg of potassium.  · Peanut butter (smooth) - 2 Tbsp has 210 mg of potassium.  · Pear - 1 medium (200 mg) of potassium.  · Pomegranate - 1 whole fruit has 400 mg of potassium.  · Pomegranate juice - ½ cup has 215 mg of potassium.  · Pork - 3 oz has 350 mg of potassium.  · Potato chips (salted) - 1 oz has 465 mg of potassium.  · Potato (baked with skin) - 1 medium has 925 mg of potassium.  · Potato (boiled) - ½ cup has 255 mg of potassium.  · Potato (Mashed) - ½ cup has 330 mg of potassium.  · Prune juice - ½ cup has 370 mg of potassium.  · Prunes - 5 have 305 mg of potassium.  · Pudding (chocolate) - ½ cup has 230 mg of potassium.  · Pumpkin (canned) - ½ cup has 250 mg of potassium.  · Raisins (seedless) - ¼ cup has 270 mg of potassium.  · Seeds (sunflower or pumpkin) - 1 oz has 240 mg of  potassium.  · Soy milk - 1 cup has 300 mg of potassium.  · Spinach (cooked) - 1/2 cup has 420 mg of potassium.  · Spinach (canned) - ½ cup has 370 mg of potassium.  · Sweet potato (baked with skin) - 1 medium has 450 mg of potassium.  · Swiss chard - ½ cup has 480 mg of potassium.  · Tomato or vegetable juice - ½ cup has 275 mg of potassium.  · Tomato (sauce or puree) - ½ cup has 400-550 mg of potassium.  · Tomato (raw) - 1 medium has 290 mg of potassium.  · Tomato (canned) - ½ cup has 200-300 mg of potassium.  · Turkey - 3 oz has 250 mg of potassium.  · Wheat germ - 1 oz has 250 mg of potassium.  · Winter squash - ½ cup has 250 mg of potassium.  · Yogurt (plain or fruited) - 6 oz has 260-435 mg of potassium.  · Zucchini - ½ cup has 220 mg of potassium.  Moderate in potassium  The following foods and beverages have 50-200 mg of potassium per serving:  · Apple - 1 fruit has 150 mg of potassium  · Apple juice - ½ cup has 150 mg of potassium  · Applesauce - ½ cup has 90 mg of potassium  · Apricot nectar - ½ cup has 140 mg of potassium  · Asparagus (small spears) - ½ cup has 155 mg of potassium  · Asparagus (large spears) - 6 have 155 mg of potassium  · Bagel (cinnamon raisin) - 1 four-inch bagel has 130 mg of potassium  · Bagel (egg or plain) - 1 four- inch bagel has 70 mg of potassium  · Beans (green) - ½ cup has 90 mg of potassium  · Beans (yellow) - ½ cup has 190 mg of potassium  · Beer, regular - 12 oz has 100 mg of potassium  · Beets (canned) - ½ cup has   125 mg of potassium  · Blackberries - ½ cup has 115 mg of potassium  · Blueberries - ½ cup has 60 mg of potassium  · Bread (whole wheat) - 1 slice has 70 mg of potassium  · Broccoli (raw) - ½ cup has 145 mg of potassium  · Cabbage - ½ cup has 150 mg of potassium  · Carrots (cooked or raw) - ½ cup has 180 mg of potassium  · Cauliflower (raw) - ½ cup has 150 mg of potassium  · Celery (raw) - ½ cup has 155 mg of potassium  · Cereal, bran flakes - ½ cup has 120-150 mg  of potassium  · Cheese (cottage) - ½ cup has 110 mg of potassium  · Cherries - 10 have 150 mg of potassium  · Chocolate - 1½ oz bar has 165 mg of potassium  · Coffee (brewed) - 6 oz has 90 mg of potassium  · Corn - ½ cup or 1 ear has 195 mg of potassium  · Cucumbers - ½ cup has 80 mg of potassium  · Egg - 1 large egg has 60 mg of potassium  · Eggplant - ½ cup has 60 mg of potassium  · Endive (raw) - ½ cup has 80 mg of potassium  · English muffin - 1 has 65 mg of potassium  · Fish (ocean perch) - 3 oz has 192 mg of potassium  · Frankfurter, beef or pork - 1 has 75 mg of potassium  · Fruit cocktail - ½ cup has 115 mg of potassium  · Grape juice - ½ cup has 170 mg of potassium  · Grapefruit - ½ fruit has 175 mg of potassium  · Grapes - ½ cup has 155 mg of potassium  · Greens: kale, turnip, collard - ½ cup has 110-150 mg of potassium  · Ice cream or frozen yogurt (chocolate) - ½ cup has 175 mg of potassium  · Ice cream or frozen yogurt (vanilla) - ½ cup has 120-150 mg of potassium  · Lemons, limes - 1 each has 80 mg of potassium  · Lettuce - 1 cup has 100 mg of potassium  · Mixed vegetables - ½ cup has 150 mg of potassium  · Mushrooms, raw - ½ cup has 110 mg of potassium  · Nuts (walnuts, pecans, or macadamia) - 1 oz has 125 mg of potassium  · Oatmeal - ½ cup has 80 mg of potassium  · Okra - ½ cup has 110 mg of potassium  · Onions - ½ cup has 120 mg of potassium  · Peach - 1 has 185 mg of potassium  · Peaches (canned) - ½ cup has 120 mg of potassium  · Pears (canned) - ½ cup has 120 mg of potassium  · Peas, green (frozen) - ½ cup has 90 mg of potassium  · Peppers (Green) - ½ cup has 130 mg of potassium  · Peppers (Red) - ½ cup has 160 mg of potassium  · Pineapple juice - ½ cup has 165 mg of potassium  · Pineapple (fresh or canned) - ½ cup has 100 mg of potassium  · Plums - 1 has 105 mg of potassium  · Pudding, vanilla - ½ cup has 150 mg of potassium  · Raspberries - ½ cup has 90 mg of potassium  · Rhubarb - ½ cup has  115 mg of potassium  · Rice, wild - ½ cup has 80 mg of potassium  ·   Shrimp - 3 oz has 155 mg of potassium  · Spinach (raw) - 1 cup has 170 mg of potassium  · Strawberries - ½ cup has 125 mg of potassium  · Summer squash - ½ cup has 175-200 mg of potassium  · Swiss chard (raw) - 1 cup has 135 mg of potassium  · Tangerines - 1 fruit has 140 mg of potassium  · Tea, brewed - 6 oz has 65 mg of potassium  · Turnips - ½ cup has 140 mg of potassium  · Watermelon - ½ cup has 85 mg of potassium  · Wine (Red, table) - 5 oz has 180 mg of potassium  · Wine (White, table) - 5 oz 100 mg of potassium  Low in potassium  The following foods and beverages have less than 50 mg of potassium per serving.  · Bread (white) - 1 slice has 30 mg of potassium  · Carbonated beverages - 12 oz has less than 5 mg of potassium  · Cheese - 1 oz has 20-30 mg of potassium  · Cranberries - ½ cup has 45 mg of potassium  · Cranberry juice cocktail - ½ cup has 20 mg of potassium  · Fats and oils - 1 Tbsp has less than 5 mg of potassium  · Hummus - 1 Tbsp has 32 mg of potassium  · Nectar (papaya, mango, or pear) - ½ cup has 35 mg of potassium  · Rice (white or brown) - ½ cup has 50 mg of potassium  · Spaghetti or macaroni (cooked) - ½ cup has 30 mg of potassium  · Tortilla, flour or corn - 1 has 50 mg of potassium  · Waffle - 1 four-inch waffle has 50 mg of potassium  · Water chestnuts - ½ cup has 40 mg of potassium  Summary  · Potassium is a mineral found in many foods and drinks. It affects how the heart works, and helps keep fluids and minerals balanced in the body.  · The amount of potassium you need each day depends on your age and any existing medical conditions you may have. Your health care provider or dietitian may recommend an amount of potassium that you should have each day.  This information is not intended to replace advice given to you by your health care provider. Make sure you discuss any questions you have with your health care  provider.  Document Released: 01/06/2005 Document Revised: 08/19/2016 Document Reviewed: 08/19/2016  [employer]vier Interactive Patient Education © 2019 [employer]
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
## 2018-08-12 NOTE — Telephone Encounter (Signed)
The pharmacy called about the patient's buproprion.  They state the patient was on XL and it was changed to SR on the new script.  Also, the directions state for the patient to take 300 mg qd but the script was for 450 mg.  They are asking for clarification or a correction to be called in, please advise, thanks.

## 2018-10-13 ENCOUNTER — Encounter: Payer: Medicaid Other | Admitting: Obstetrics and Gynecology

## 2018-11-08 ENCOUNTER — Other Ambulatory Visit: Payer: Self-pay | Admitting: *Deleted

## 2018-11-08 MED ORDER — ESTRADIOL 2 MG PO TABS: 2.0000 mg | ORAL_TABLET | Freq: Every day | ORAL | 3 refills | Status: DC

## 2018-11-08 MED ORDER — VITAMIN D (ERGOCALCIFEROL) 1.25 MG (50000 UNIT) PO CAPS: 50000.0000 [IU] | ORAL_CAPSULE | ORAL | 1 refills | Status: DC

## 2018-11-21 ENCOUNTER — Telehealth: Payer: Self-pay | Admitting: Obstetrics and Gynecology

## 2018-11-21 NOTE — Telephone Encounter (Signed)
The patient called and stated that she has a medication issue and is wanting to know if a medication was sent into her pharmacy or not. The patient is requesting a call back. Please advise.

## 2018-11-22 ENCOUNTER — Other Ambulatory Visit: Payer: Self-pay | Admitting: *Deleted

## 2018-11-22 MED ORDER — PROGESTERONE MICRONIZED 200 MG PO CAPS: 200.0000 mg | ORAL_CAPSULE | Freq: Every day | ORAL | 1 refills | Status: DC

## 2018-11-23 NOTE — Telephone Encounter (Signed)
Sent in rx.

## 2018-12-07 ENCOUNTER — Telehealth: Payer: Self-pay | Admitting: *Deleted

## 2018-12-07 NOTE — Telephone Encounter (Signed)
Coronavirus (COVID-19) Are you at risk?  Are you at risk for the Coronavirus (COVID-19)?  To be considered HIGH RISK for Coronavirus (COVID-19), you have to meet the following criteria:  . Traveled to [place], [place], [place], [place] or [place]; or in the [place] to [place], [place], [place], or [place]; and have fever, cough, and shortness of breath within the last 2 weeks of travel OR . Been in close contact with a person diagnosed with COVID-19 within the last 2 weeks and have fever, cough, and shortness of breath . IF YOU DO NOT MEET THESE CRITERIA, YOU ARE CONSIDERED LOW RISK FOR COVID-19.  What to do if you are HIGH RISK for COVID-19?  . If you are having a medical emergency, call 911. . Seek medical care right away. Before you go to a doctor's office, urgent care or emergency department, call ahead and tell them about your recent travel, contact with someone diagnosed with COVID-19, and your symptoms. You should receive instructions from your physician's office regarding next steps of care.  . When you arrive at healthcare provider, tell the healthcare staff immediately you have returned from visiting [place], [place], [place], [place] or [place]; or traveled in the [place] to [place], [place], [place], or [place]; in the last two weeks or you have been in close contact with a person diagnosed with COVID-19 in the last 2 weeks.   . Tell the health care staff about your symptoms: fever, cough and shortness of breath. . After you have been seen by a medical provider, you will be either: o Tested for (COVID-19) and discharged home on quarantine except to seek medical care if symptoms worsen, and asked to  - Stay home and avoid contact with others until you get your results (4-5 days)  - Avoid travel on public transportation if possible (such as bus, train, or airplane) or o Sent to the Emergency Department by EMS for evaluation, COVID-19 testing, and possible  admission depending on your condition and test results.  What to do if you are LOW RISK for COVID-19?  Reduce your risk of any infection by using the same precautions used for avoiding the common cold or flu:  . Wash your hands often with soap and warm water for at least 20 seconds.  If soap and water are not readily available, use an alcohol-based hand sanitizer with at least 60% alcohol.  . If coughing or sneezing, cover your mouth and nose by coughing or sneezing into the elbow areas of your shirt or coat, into a tissue or into your sleeve (not your hands). . Avoid shaking hands with others and consider head nods or verbal greetings only. . Avoid touching your eyes, nose, or mouth with unwashed hands.  . Avoid close contact with people who are sick. . Avoid places or events with large numbers of people in one location, like concerts or sporting events. . Carefully consider travel plans you have or are making. . If you are planning any travel outside or inside the [place], visit the CDC's Travelers' Health webpage for the latest health notices. . If you have some symptoms but not all symptoms, continue to monitor at home and seek medical attention if your symptoms worsen. . If you are having a medical emergency, call 911.   ADDITIONAL HEALTHCARE OPTIONS FOR PATIENTS  [facility] / e-Visit: [email or web address]         [facility]: [phone]  [facility]: [phone]                   [facility]: [phone]   Spoke with pt denies any sx.   , CMA 

## 2018-12-08 ENCOUNTER — Other Ambulatory Visit (HOSPITAL_COMMUNITY)
Admission: RE | Admit: 2018-12-08 | Discharge: 2018-12-08 | Disposition: A | Discharge status: Home / Self Care | Payer: Medicaid Other | Source: Ambulatory Visit | Attending: Obstetrics and Gynecology | Admitting: Obstetrics and Gynecology

## 2018-12-08 ENCOUNTER — Encounter: Payer: Self-pay | Admitting: Obstetrics and Gynecology

## 2018-12-08 ENCOUNTER — Ambulatory Visit (INDEPENDENT_AMBULATORY_CARE_PROVIDER_SITE_OTHER): Payer: Medicaid Other | Admitting: Obstetrics and Gynecology

## 2018-12-08 ENCOUNTER — Other Ambulatory Visit: Payer: Self-pay

## 2018-12-08 VITALS — BP 106/79 | HR 75 | Ht 64.0 in | Wt 119.7 lb

## 2018-12-08 DIAGNOSIS — Z23 Encounter for immunization: Secondary | ICD-10-CM | POA: Diagnosis not present

## 2018-12-08 DIAGNOSIS — Z01419 Encounter for gynecological examination (general) (routine) without abnormal findings: Secondary | ICD-10-CM | POA: Insufficient documentation

## 2018-12-08 DIAGNOSIS — Z0000 Encounter for general adult medical examination without abnormal findings: Secondary | ICD-10-CM | POA: Diagnosis not present

## 2018-12-08 MED ORDER — TETANUS-DIPHTH-ACELL PERTUSSIS 5-2.5-18.5 LF-MCG/0.5 IM SUSP
0.5000 mL | Freq: Once | INTRAMUSCULAR | Status: AC
  Administered 2018-12-08: 0.5 mL via INTRAMUSCULAR

## 2018-12-08 NOTE — Progress Notes (Signed)
Subjective:   Maria Silva is a 33 y.o. G46P0 Caucasian female here for a routine well-woman exam.  No LMP recorded. Patient has had a hysterectomy.    Current complaints: hearing loss- not new but is worse PCP: none       does desire labs  Social History: Sexual: heterosexual Marital Status: separated Living situation: with family Occupation: Tour manager Tobacco/alcohol: no tobacco use Illicit drugs: no history of illicit drug use  The following portions of the patient's history were reviewed and updated as appropriate: allergies, current medications, past family history, past medical history, past social history, past surgical history and problem list.  Past Medical History Past Medical History:  Diagnosis Date  . Asthma   . Benign tumor of cervix   . COPD (chronic obstructive pulmonary disease) ([place])   . Kidney stone   . Migraine   . Ovarian tumor   . Sternal fracture   . SVT (supraventricular tachycardia) [facility])     Past Surgical History Past Surgical History:  Procedure Laterality Date  . ABDOMINAL HYSTERECTOMY    . APPENDECTOMY    . BLADDER REPAIR    . BLADDER REPAIR W/ CESAREAN SECTION    . bladder stent    . Laparascopic    . MINOR HEMORRHOIDECTOMY    . TUMOR REMOVAL      Gynecologic History G3P0  No LMP recorded. Patient has had a hysterectomy. Contraception: status post hysterectomy Last Pap: 10/2017. Results were: normal   Obstetric History OB History  Gravida Para Term Preterm AB Living  3         3  SAB TAB Ectopic Multiple Live Births          3    # Outcome Date GA Lbr Len/2nd Weight Sex Delivery Anes PTL Lv  3 Gravida 2010    M CS-Unspec   LIV  2 Gravida 2009    M CS-Unspec   LIV  1 Gravida 2008    F Vag-Spont   LIV    Current Medications Current Outpatient Medications on File Prior to Visit  Medication Sig Dispense Refill  . budesonide-formoterol (SYMBICORT) 160-4.5 MCG/ACT inhaler Inhale 2 puffs into the lungs 2 (two) times  daily. 1 Inhaler 6  . buPROPion 450 MG TB24 Take 300 mg by mouth daily. 30 tablet 6  . estradiol (ESTRACE) 2 MG tablet Take 1 tablet (2 mg total) by mouth daily. 90 tablet 3  . nitrofurantoin, macrocrystal-monohydrate, (MACROBID) 100 MG capsule Take 1 capsule (100 mg total) by mouth daily. 60 capsule 6  . potassium chloride (K-DUR) 10 MEQ tablet Take 1 tablet (10 mEq total) by mouth daily. 30 tablet 1  . progesterone (PROMETRIUM) 200 MG capsule Take 1 capsule (200 mg total) by mouth daily. 90 capsule 1  . promethazine (PHENERGAN) 25 MG tablet Take 1 tablet (25 mg total) by mouth every 6 (six) hours as needed for nausea or vomiting. 10 tablet 0  . topiramate (TOPAMAX) 100 MG tablet Take 1 tablet (100 mg total) by mouth daily. Pt takes 150mg  daily 120 tablet 4  . Vitamin D, Ergocalciferol, (DRISDOL) 1.25 MG (50000 UT) CAPS capsule Take 1 capsule (50,000 Units total) by mouth every 7 (seven) days. 30 capsule 1  . ondansetron (ZOFRAN-ODT) 4 MG disintegrating tablet Take 1 tablet (4 mg total) by mouth every 8 (eight) hours as needed for nausea or vomiting. (Patient not taking: Reported on 08/12/2018) 20 tablet 0   No current facility-administered medications on file prior to  visit.     Review of Systems Patient denies any headaches, blurred vision, shortness of breath, chest pain, abdominal pain, problems with bowel movements, urination, or intercourse.  Objective:  BP 106/79   Pulse 75   Ht 5\' 4"  (1.626 m)   Wt 119 lb 11.2 oz (54.3 kg)   BMI 20.55 kg/m  Physical Exam  General:  Well developed, well nourished, no acute distress. She is alert and oriented x3. Skin:  Warm and dry Neck:  Midline trachea, no thyromegaly or nodules Cardiovascular: Regular rate and rhythm, no murmur heard Lungs:  Effort normal, all lung fields clear to auscultation bilaterally Breasts:  No dominant palpable mass, retraction, or nipple discharge Abdomen:  Soft, non tender, no hepatosplenomegaly or masses Pelvic:   External genitalia is normal in appearance.  The vagina is normal in appearance. Vaginal cuff without defect.  Thin prep pap is done with HR HPV cotesting. Uterus is surgically absent  No adnexal masses or tenderness noted. Extremities:  No swelling or varicosities noted Psych:  She has a normal mood and affect  Assessment:   Healthy well-woman exam Needs Tdap  Plan:  Tdap given Labs obtained-will follow up accordingly. F/U 1 year for AE, or sooner if needed   Kahlyn Shippey Rockney Ghee, CNM

## 2018-12-08 NOTE — Addendum Note (Signed)
Addended by: Keturah Barre L on: 12/08/2018 01:26 PM   Modules accepted: Orders

## 2018-12-08 NOTE — Patient Instructions (Signed)
 Preventive Care 21-31 Years Old, Female Preventive care refers to visits with your health care provider and lifestyle choices that can promote health and wellness. This includes:  A yearly physical exam. This may also be called an annual well check.  Regular dental visits and eye exams.  Immunizations.  Screening for certain conditions.  Healthy lifestyle choices, such as eating a healthy diet, getting regular exercise, not using drugs or products that contain nicotine and tobacco, and limiting alcohol use. What can I expect for my preventive care visit? Physical exam Your health care provider will check your:  Height and weight. This may be used to calculate body mass index (BMI), which tells if you are at a healthy weight.  Heart rate and blood pressure.  Skin for abnormal spots. Counseling Your health care provider may ask you questions about your:  Alcohol, tobacco, and drug use.  Emotional well-being.  Home and relationship well-being.  Sexual activity.  Eating habits.  Work and work environment.  Method of birth control.  Menstrual cycle.  Pregnancy history. What immunizations do I need?  Influenza (flu) vaccine  This is recommended every year. Tetanus, diphtheria, and pertussis (Tdap) vaccine  You may need a Td booster every 10 years. Varicella (chickenpox) vaccine  You may need this if you have not been vaccinated. Human papillomavirus (HPV) vaccine  If recommended by your health care provider, you may need three doses over 6 months. Measles, mumps, and rubella (MMR) vaccine  You may need at least one dose of MMR. You may also need a second dose. Meningococcal conjugate (MenACWY) vaccine  One dose is recommended if you are age 19-21 years and a first-year college student living in a residence hall, or if you have one of several medical conditions. You may also need additional booster doses. Pneumococcal conjugate (PCV13) vaccine  You may need  this if you have certain conditions and were not previously vaccinated. Pneumococcal polysaccharide (PPSV23) vaccine  You may need one or two doses if you smoke cigarettes or if you have certain conditions. Hepatitis A vaccine  You may need this if you have certain conditions or if you travel or work in places where you may be exposed to hepatitis A. Hepatitis B vaccine  You may need this if you have certain conditions or if you travel or work in places where you may be exposed to hepatitis B. Haemophilus influenzae type b (Hib) vaccine  You may need this if you have certain conditions. You may receive vaccines as individual doses or as more than one vaccine together in one shot (combination vaccines). Talk with your health care provider about the risks and benefits of combination vaccines. What tests do I need?  Blood tests  Lipid and cholesterol levels. These may be checked every 5 years starting at age 20.  Hepatitis C test.  Hepatitis B test. Screening  Diabetes screening. This is done by checking your blood sugar (glucose) after you have not eaten for a while (fasting).  Sexually transmitted disease (STD) testing.  BRCA-related cancer screening. This may be done if you have a family history of breast, ovarian, tubal, or peritoneal cancers.  Pelvic exam and Pap test. This may be done every 3 years starting at age 21. Starting at age 30, this may be done every 5 years if you have a Pap test in combination with an HPV test. Talk with your health care provider about your test results, treatment options, and if necessary, the need for more   tests. Follow these instructions at home: Eating and drinking   Eat a diet that includes fresh fruits and vegetables, whole grains, lean protein, and low-fat dairy.  Take vitamin and mineral supplements as recommended by your health care provider.  Do not drink alcohol if: ? Your health care provider tells you not to drink. ? You are  pregnant, may be pregnant, or are planning to become pregnant.  If you drink alcohol: ? Limit how much you have to 0-1 drink a day. ? Be aware of how much alcohol is in your drink. In the [place] one drink equals one 12 oz bottle of beer (355 mL), one 5 oz glass of wine (148 mL), or one 1 oz glass of hard liquor (44 mL). Lifestyle  Take daily care of your teeth and gums.  Stay active. Exercise for at least 30 minutes on 5 or more days each week.  Do not use any products that contain nicotine or tobacco, such as cigarettes, e-cigarettes, and chewing tobacco. If you need help quitting, ask your health care provider.  If you are sexually active, practice safe sex. Use a condom or other form of birth control (contraception) in order to prevent pregnancy and STIs (sexually transmitted infections). If you plan to become pregnant, see your health care provider for a preconception visit. What's next?  Visit your health care provider once a year for a well check visit.  Ask your health care provider how often you should have your eyes and teeth checked.  Stay up to date on all vaccines. This information is not intended to replace advice given to you by your health care provider. Make sure you discuss any questions you have with your health care provider. Document Released: 07/21/2001 Document Revised: 02/03/2018 Document Reviewed: 02/03/2018 Elsevier Patient Education  2020 [employer].

## 2018-12-09 LAB — COMPREHENSIVE METABOLIC PANEL
ALT: 14 IU/L (ref 0–32)
AST: 11 IU/L (ref 0–40)
Albumin/Globulin Ratio: 2.1 (ref 1.2–2.2)
Albumin: 4.7 g/dL (ref 3.8–4.8)
Alkaline Phosphatase: 44 IU/L (ref 39–117)
BUN/Creatinine Ratio: 14 (ref 9–23)
BUN: 11 mg/dL (ref 6–20)
Bilirubin Total: 0.3 mg/dL (ref 0.0–1.2)
CO2: 20 mmol/L (ref 20–29)
Calcium: 9.5 mg/dL (ref 8.7–10.2)
Chloride: 108 mmol/L — ABNORMAL HIGH (ref 96–106)
Creatinine, Ser: 0.76 mg/dL (ref 0.57–1.00)
GFR calc Af Amer: 121 mL/min/{1.73_m2} (ref >59)
GFR calc non Af Amer: 105 mL/min/{1.73_m2} (ref >59)
Globulin, Total: 2.2 g/dL (ref 1.5–4.5)
Glucose: 73 mg/dL (ref 65–99)
Potassium: 4.2 mmol/L (ref 3.5–5.2)
Sodium: 139 mmol/L (ref 134–144)
Total Protein: 6.9 g/dL (ref 6.0–8.5)

## 2018-12-09 LAB — CBC
Hematocrit: 41.5 % (ref 34.0–46.6)
Hemoglobin: 14.2 g/dL (ref 11.1–15.9)
MCH: 32.3 pg (ref 26.6–33.0)
MCHC: 34.2 g/dL (ref 31.5–35.7)
MCV: 95 fL (ref 79–97)
Platelets: 272 10*3/uL (ref 150–450)
RBC: 4.39 x10E6/uL (ref 3.77–5.28)
RDW: 11.6 % — ABNORMAL LOW (ref 11.7–15.4)
WBC: 3.5 10*3/uL (ref 3.4–10.8)

## 2018-12-09 LAB — LIPID PANEL
Chol/HDL Ratio: 3.3 ratio (ref 0.0–4.4)
Cholesterol, Total: 203 mg/dL — ABNORMAL HIGH (ref 100–199)
HDL: 62 mg/dL (ref >39)
LDL Calculated: 109 mg/dL — ABNORMAL HIGH (ref 0–99)
Triglycerides: 161 mg/dL — ABNORMAL HIGH (ref 0–149)
VLDL Cholesterol Cal: 32 mg/dL (ref 5–40)

## 2018-12-09 LAB — TSH: TSH: 1.9 u[IU]/mL (ref 0.450–4.500)

## 2018-12-14 LAB — CYTOLOGY - PAP
Chlamydia: NEGATIVE
Diagnosis: NEGATIVE
HPV: NOT DETECTED
Neisseria Gonorrhea: NEGATIVE

## 2018-12-16 ENCOUNTER — Telehealth: Payer: Self-pay

## 2018-12-16 NOTE — Telephone Encounter (Signed)
Patient informed and refused copy of labs.

## 2018-12-16 NOTE — Telephone Encounter (Signed)
-----   Message from Joylene Igo, [place] sent at 12/16/2018  1:16 PM EDT ----- Please let her know pap was negative

## 2019-01-13 ENCOUNTER — Ambulatory Visit
Admission: EM | Admit: 2019-01-13 | Discharge: 2019-01-13 | Disposition: A | Discharge status: Home / Self Care | Payer: Medicaid Other | Attending: Emergency Medicine | Admitting: Emergency Medicine

## 2019-01-13 ENCOUNTER — Other Ambulatory Visit: Payer: Self-pay

## 2019-01-13 DIAGNOSIS — S70361A Insect bite (nonvenomous), right thigh, initial encounter: Secondary | ICD-10-CM | POA: Diagnosis not present

## 2019-01-13 DIAGNOSIS — W57XXXA Bitten or stung by nonvenomous insect and other nonvenomous arthropods, initial encounter: Secondary | ICD-10-CM | POA: Diagnosis not present

## 2019-01-13 MED ORDER — MUPIROCIN 2 % EX OINT: 1.0000 "application " | TOPICAL_OINTMENT | Freq: Three times a day (TID) | CUTANEOUS | 0 refills | Status: DC

## 2019-01-13 MED ORDER — DOXYCYCLINE HYCLATE 100 MG PO CAPS: 100.0000 mg | ORAL_CAPSULE | Freq: Two times a day (BID) | ORAL | 0 refills | Status: DC

## 2019-01-13 NOTE — ED Triage Notes (Signed)
Pt has a bite on the right side of her thigh and states it happened 3 days ago and hurts when she moves her right leg. Warm to the touch and painful. Unsure what bit her.

## 2019-01-13 NOTE — ED Provider Notes (Signed)
[facility]    CSN: [id number] Arrival date & time: 01/13/19  9628      History   Chief Complaint Chief Complaint  Patient presents with  . Insect Bite    HPI Elise Jakya Dovidio is a 33 y.o. female.   HPI  33 year old female presents with a possible insect bite on her upper lateral right thigh.  She states that at her work, they were infested with bugs and they fumigated.  She has noticed multiple insect bites on her lower extremities mostly from  possible fleas which were found after the fumigation..  All of the other bites have been doing well except for the upper right thigh that has become very red swollen and extremely tender.  She has noticed that the area has been spreading in diameter.  An EMT friend of hers told her that it appeared to becoming infected and should be seen.  She denies any fevers.  She has been using a topical antibiotic ointment without success.  She states in the past she has been seen in our clinic for  previous abscesses that required drainage.        Past Medical History:  Diagnosis Date  . Asthma   . Benign tumor of cervix   . COPD (chronic obstructive pulmonary disease) ([place])   . Kidney stone   . Migraine   . Ovarian tumor   . Sternal fracture   . SVT (supraventricular tachycardia) [facility])     Patient Active Problem List   Diagnosis Date Noted  . Vitamin D deficiency 10/12/2017  . Elevated cholesterol 10/12/2017    Past Surgical History:  Procedure Laterality Date  . ABDOMINAL HYSTERECTOMY    . APPENDECTOMY    . BLADDER REPAIR    . BLADDER REPAIR W/ CESAREAN SECTION    . bladder stent    . Laparascopic    . MINOR HEMORRHOIDECTOMY    . TUMOR REMOVAL      OB History    Gravida  3   Para      Term      Preterm      AB      Living  3     SAB      TAB      Ectopic      Multiple      Live Births  3            Home Medications    Prior to Admission medications   Medication Sig Start Date End  Date Taking? Authorizing Provider  buPROPion 450 MG TB24 Take 300 mg by mouth daily. 08/12/18  Yes Shambley, Melody N, CNM  estradiol (ESTRACE) 2 MG tablet Take 1 tablet (2 mg total) by mouth daily. 11/08/18  Yes Shambley, Melody N, CNM  nitrofurantoin, macrocrystal-monohydrate, (MACROBID) 100 MG capsule Take 1 capsule (100 mg total) by mouth daily. 06/10/18  Yes Shambley, Melody N, CNM  potassium chloride (K-DUR) 10 MEQ tablet Take 1 tablet (10 mEq total) by mouth daily. 08/12/18  Yes Shambley, Melody N, CNM  progesterone (PROMETRIUM) 200 MG capsule Take 1 capsule (200 mg total) by mouth daily. 11/22/18  Yes Shambley, Melody N, CNM  topiramate (TOPAMAX) 100 MG tablet Take 1 tablet (100 mg total) by mouth daily. Pt takes 150mg  daily 08/11/18  Yes Shambley, Melody N, CNM  Vitamin D, Ergocalciferol, (DRISDOL) 1.25 MG (50000 UT) CAPS capsule Take 1 capsule (50,000 Units total) by mouth every 7 (seven) days. 11/08/18  Yes [place], Melody  N, CNM  budesonide-formoterol (SYMBICORT) 160-4.5 MCG/ACT inhaler Inhale 2 puffs into the lungs 2 (two) times daily. 10/07/17   Shambley, Melody N, CNM  doxycycline (VIBRAMYCIN) 100 MG capsule Take 1 capsule (100 mg total) by mouth 2 (two) times daily. 01/13/19   Lorin Picket, PA-C  mupirocin ointment (BACTROBAN) 2 % Apply 1 application topically 3 (three) times daily. 01/13/19   Lorin Picket, PA-C  ondansetron (ZOFRAN-ODT) 4 MG disintegrating tablet Take 1 tablet (4 mg total) by mouth every 8 (eight) hours as needed for nausea or vomiting. Patient not taking: Reported on 08/12/2018 04/26/18   Coral Spikes, DO  promethazine (PHENERGAN) 25 MG tablet Take 1 tablet (25 mg total) by mouth every 6 (six) hours as needed for nausea or vomiting. 05/09/18   Norval Gable, MD    Family History Family History  Adopted: Yes  Problem Relation Age of Onset  . Heart disease Maternal Grandfather   . Renal Disease Brother   . Wilson's disease Son     Social History Social History    Tobacco Use  . Smoking status: Former Research scientist (life sciences)  . Smokeless tobacco: Never Used  Substance Use Topics  . Alcohol use: Yes    Comment: occasionaly  . Drug use: No     Allergies   Amoxicillin, Tape, and Vicodin [hydrocodone-acetaminophen]   Review of Systems Review of Systems  Constitutional: Positive for activity change. Negative for appetite change, chills, diaphoresis, fatigue and fever.  Skin: Positive for color change and wound.  All other systems reviewed and are negative.    Physical Exam Triage Vital Signs ED Triage Vitals  Enc Vitals Group     BP 01/13/19 0959 (!) 147/79     Pulse Rate 01/13/19 0959 86     Resp 01/13/19 0959 18     Temp 01/13/19 0959 98.1 F (36.7 C)     Temp Source 01/13/19 0959 Oral     SpO2 01/13/19 0959 100 %     Weight 01/13/19 1000 120 lb (54.4 kg)     Height --      Head Circumference --      Peak Flow --      Pain Score 01/13/19 1000 7     Pain Loc --      Pain Edu? --      Excl. in [place]? --    No data found.  Updated Vital Signs BP (!) 147/79 (BP Location: Right Arm)   Pulse 86   Temp 98.1 F (36.7 C) (Oral)   Resp 18   Wt 120 lb (54.4 kg)   SpO2 100%   BMI 20.60 kg/m   Visual Acuity Right Eye Distance:   Left Eye Distance:   Bilateral Distance:    Right Eye Near:   Left Eye Near:    Bilateral Near:     Physical Exam Vitals signs and nursing note reviewed.  Constitutional:      General: She is not in acute distress.    Appearance: Normal appearance. She is normal weight. She is not ill-appearing.  HENT:     Head: Normocephalic and atraumatic.     Nose: Nose normal.     Mouth/Throat:     Mouth: Mucous membranes are moist.  Eyes:     Conjunctiva/sclera: Conjunctivae normal.     Pupils: Pupils are equal, round, and reactive to light.  Neck:     Musculoskeletal: Normal range of motion and neck supple.  Musculoskeletal: Normal range of motion.  Skin:    General: Skin is warm and dry.     Findings: Erythema  present.     Comments: Examination of the right upper lateral thigh shows a large very firm erythematous nodule with a pustule in the midportion.  Surrounding erythema probably 5 cm in diameter.  The lesion is warm tender blanchable.  There is no discharge present.  Neurological:     General: No focal deficit present.     Mental Status: She is alert and oriented to person, place, and time.  Psychiatric:        Mood and Affect: Mood normal.        Behavior: Behavior normal.        Thought Content: Thought content normal.        Judgment: Judgment normal.      UC Treatments / Results  Labs (all labs ordered are listed, but only abnormal results are displayed) Labs Reviewed - No data to display  EKG   Radiology No results found.  Procedures Procedures (including critical care time)  Medications Ordered in UC Medications - No data to display  Initial Impression / Assessment and Plan / UC Course  I have reviewed the triage vital signs and the nursing notes.  Pertinent labs & imaging results that were available during my care of the patient were reviewed by me and considered in my medical decision making (see chart for details).   Patient appears to have an infected insect bite.  Placed her on warm compresses 3 times daily for 10 minutes each time.  She will be given doxycycline 100mg  twice daily that she will take for 5 days.  She will apply mupirocin ointment to the area and surrounding erythema.  If the lesion does not seem to be improving in 2 days or is worsening she should return immediately to our clinic.   Final Clinical Impressions(s) / UC Diagnoses   Final diagnoses:  Insect bite of right thigh, initial encounter     Discharge Instructions     Wet a washcloth under the faucet and then place in the microwave oven for 10 to 15 seconds.  Place the cloth over the abscess leaving it there for 10 minutes.  Dry the area thoroughly and apply mupirocin ointment to all areas  of hardness.  Perform this 3-4 times each and every day until the abscess has resolved.  Be certain to take all of the doxycycline.  If the area appears worsening return to our clinic or go to your primary care physician      ED Prescriptions    Medication Sig Dispense Auth. Provider   doxycycline (VIBRAMYCIN) 100 MG capsule Take 1 capsule (100 mg total) by mouth 2 (two) times daily. 10 capsule Crecencio Mc P, PA-C   mupirocin ointment (BACTROBAN) 2 % Apply 1 application topically 3 (three) times daily. 22 g Lorin Picket, PA-C     Controlled Substance Prescriptions [place] Controlled Substance Registry consulted? Not Applicable   Lorin Picket, PA-C 01/13/19 1043

## 2019-01-13 NOTE — Discharge Instructions (Addendum)
Wet a washcloth under the faucet and then place in the microwave oven for 10 to 15 seconds.  Place the cloth over the abscess leaving it there for 10 minutes.  Dry the area thoroughly and apply mupirocin ointment to all areas of hardness.  Perform this 3-4 times each and every day until the abscess has resolved.  Be certain to take all of the doxycycline.  If the area appears worsening return to our clinic or go to your primary care physician

## 2019-01-15 ENCOUNTER — Ambulatory Visit
Admission: EM | Admit: 2019-01-15 | Discharge: 2019-01-15 | Disposition: A | Discharge status: Home / Self Care | Payer: Medicaid Other | Attending: Family Medicine | Admitting: Family Medicine

## 2019-01-15 ENCOUNTER — Other Ambulatory Visit: Payer: Self-pay

## 2019-01-15 DIAGNOSIS — L0291 Cutaneous abscess, unspecified: Secondary | ICD-10-CM | POA: Diagnosis not present

## 2019-01-15 MED ORDER — LIDOCAINE-EPINEPHRINE 1 %-1:100000 IJ SOLN
10.0000 mL | Freq: Once | INTRAMUSCULAR | Status: AC
  Administered 2019-01-15: 11:00:00 10 mL via INTRADERMAL

## 2019-01-15 NOTE — ED Triage Notes (Signed)
Pt was here 2 days ago and saw bill for abscess on her right thigh and states the redness around the abscess is getting bigger and was told if it gets worse to return and we would lance it. She states she is feeling more pain and pressure.

## 2019-01-15 NOTE — Discharge Instructions (Signed)
Finish antibiotic  Pull packing in 48 hours.  Take care  Dr. Lacinda Axon

## 2019-01-15 NOTE — ED Provider Notes (Signed)
[facility]    CSN: [id number] Arrival date & time: 01/15/19  1015   History   Chief Complaint Chief Complaint  Patient presents with  . Follow-up   HPI  33 year old female presents for follow-up regarding an abscess.  Patient was seen on 8/7.  Was placed on Bactroban ointment and doxycycline.  Patient reports that she has had little improvement over the past 2 days.  She states that the area seems to be getting larger and is quite painful.  L:ocation: right lateral thigh. Pain is 8/10 in severity.  She believes that the needs to be drained at this time.  No fever.  No chills.  She endorses compliance with her medications.  No other associated symptoms.  No other complaints.  PMH, Surgical Hx, Family Hx, Social History reviewed and updated as below.  Past Medical History:  Diagnosis Date  . Asthma   . Benign tumor of cervix   . COPD (chronic obstructive pulmonary disease) ([place])   . Kidney stone   . Migraine   . Ovarian tumor   . Sternal fracture   . SVT (supraventricular tachycardia) [facility])     Patient Active Problem List   Diagnosis Date Noted  . Vitamin D deficiency 10/12/2017  . Elevated cholesterol 10/12/2017    Past Surgical History:  Procedure Laterality Date  . ABDOMINAL HYSTERECTOMY    . APPENDECTOMY    . BLADDER REPAIR    . BLADDER REPAIR W/ CESAREAN SECTION    . bladder stent    . Laparascopic    . MINOR HEMORRHOIDECTOMY    . TUMOR REMOVAL      OB History    Gravida  3   Para      Term      Preterm      AB      Living  3     SAB      TAB      Ectopic      Multiple      Live Births  3            Home Medications    Prior to Admission medications   Medication Sig Start Date End Date Taking? Authorizing Provider  buPROPion 450 MG TB24 Take 300 mg by mouth daily. 08/12/18  Yes Shambley, Melody N, CNM  budesonide-formoterol (SYMBICORT) 160-4.5 MCG/ACT inhaler Inhale 2 puffs into the lungs 2 (two) times daily. 10/07/17    Shambley, Melody N, CNM  doxycycline (VIBRAMYCIN) 100 MG capsule Take 1 capsule (100 mg total) by mouth 2 (two) times daily. 01/13/19   Lorin Picket, PA-C  estradiol (ESTRACE) 2 MG tablet Take 1 tablet (2 mg total) by mouth daily. 11/08/18   Shambley, Melody N, CNM  mupirocin ointment (BACTROBAN) 2 % Apply 1 application topically 3 (three) times daily. 01/13/19   Lorin Picket, PA-C  nitrofurantoin, macrocrystal-monohydrate, (MACROBID) 100 MG capsule Take 1 capsule (100 mg total) by mouth daily. 06/10/18   Shambley, Melody N, CNM  ondansetron (ZOFRAN-ODT) 4 MG disintegrating tablet Take 1 tablet (4 mg total) by mouth every 8 (eight) hours as needed for nausea or vomiting. Patient not taking: Reported on 08/12/2018 04/26/18   Coral Spikes, DO  potassium chloride (K-DUR) 10 MEQ tablet Take 1 tablet (10 mEq total) by mouth daily. 08/12/18   Shambley, Melody N, CNM  progesterone (PROMETRIUM) 200 MG capsule Take 1 capsule (200 mg total) by mouth daily. 11/22/18   Shambley, Melody N, CNM  promethazine (PHENERGAN)  25 MG tablet Take 1 tablet (25 mg total) by mouth every 6 (six) hours as needed for nausea or vomiting. 05/09/18   Norval Gable, MD  topiramate (TOPAMAX) 100 MG tablet Take 1 tablet (100 mg total) by mouth daily. Pt takes 150mg  daily 08/11/18   Shambley, Melody N, CNM  Vitamin D, Ergocalciferol, (DRISDOL) 1.25 MG (50000 UT) CAPS capsule Take 1 capsule (50,000 Units total) by mouth every 7 (seven) days. 11/08/18   Joylene Igo, CNM    Family History Family History  Adopted: Yes  Problem Relation Age of Onset  . Heart disease Maternal Grandfather   . Renal Disease Brother   . Wilson's disease Son     Social History Social History   Tobacco Use  . Smoking status: Former Research scientist (life sciences)  . Smokeless tobacco: Never Used  Substance Use Topics  . Alcohol use: Yes    Comment: occasionaly  . Drug use: No     Allergies   Amoxicillin, Tape, and Vicodin [hydrocodone-acetaminophen]   Review of  Systems Review of Systems  Constitutional: Negative.   Skin:       Abscess.   Physical Exam Triage Vital Signs ED Triage Vitals  Enc Vitals Group     BP 01/15/19 1023 111/87     Pulse Rate 01/15/19 1023 78     Resp 01/15/19 1023 18     Temp 01/15/19 1023 98.4 F (36.9 C)     Temp Source 01/15/19 1023 Oral     SpO2 01/15/19 1023 100 %     Weight 01/15/19 1022 119 lb 14.9 oz (54.4 kg)     Height --      Head Circumference --      Peak Flow --      Pain Score 01/15/19 1022 8     Pain Loc --      Pain Edu? --      Excl. in [place]? --    Updated Vital Signs BP 111/87 (BP Location: Right Arm)   Pulse 78   Temp 98.4 F (36.9 C) (Oral)   Resp 18   Wt 54.4 kg   SpO2 100%   BMI 20.59 kg/m   Visual Acuity Right Eye Distance:   Left Eye Distance:   Bilateral Distance:    Right Eye Near:   Left Eye Near:    Bilateral Near:     Physical Exam Constitutional:      General: She is not in acute distress.    Appearance: Normal appearance.  HENT:     Head: Normocephalic and atraumatic.  Eyes:     General:        Right eye: No discharge.        Left eye: No discharge.     Conjunctiva/sclera: Conjunctivae normal.  Pulmonary:     Effort: Pulmonary effort is normal. No respiratory distress.     Breath sounds: Normal breath sounds.  Skin:         Comments: Large area of erythema, induration, and fluctuance noted over the right lateral thigh.  Neurological:     Mental Status: She is alert.  Psychiatric:        Mood and Affect: Mood normal.        Behavior: Behavior normal.    UC Treatments / Results  Labs (all labs ordered are listed, but only abnormal results are displayed) Labs Reviewed - No data to display  EKG   Radiology No results found.  Procedures Incision and Drainage  Date/Time:  01/15/2019 11:04 AM Performed by: Coral Spikes, DO Authorized by: Coral Spikes, DO   Consent:    Consent obtained:  Verbal   Consent given by:  Patient Location:     Type:  Abscess   Location:  Lower extremity   Lower extremity location:  Leg   Leg location:  R upper leg Pre-procedure details:    Skin preparation:  Betadine Anesthesia (see MAR for exact dosages):    Anesthesia method:  Local infiltration   Local anesthetic:  Lidocaine 1% WITH epi Procedure type:    Complexity:  Simple Procedure details:    Incision types:  Stab incision   Scalpel blade:  11   Wound management:  Probed and deloculated   Drainage:  Bloody and purulent   Drainage amount:  Moderate   Packing materials:  1/4 in gauze Post-procedure details:    Patient tolerance of procedure:  Tolerated well, no immediate complications   (including critical care time)  Medications Ordered in UC Medications  lidocaine-EPINEPHrine (XYLOCAINE W/EPI) 1 %-1:100000 (with pres) injection 10 mL (10 mLs Intradermal Given by Other 01/15/19 1053)    Initial Impression / Assessment and Plan / UC Course  I have reviewed the triage vital signs and the nursing notes.  Pertinent labs & imaging results that were available during my care of the patient were reviewed by me and considered in my medical decision making (see chart for details).    33 year old female presents with abscess.  Incision and drainage performed today.  See above.  Finish doxycycline.  Information given regarding aftercare.    Final Clinical Impressions(s) / UC Diagnoses   Final diagnoses:  Abscess     Discharge Instructions     Finish antibiotic  Pull packing in 48 hours.  Take care  Dr. Lacinda Axon    ED Prescriptions    None     Controlled Substance Prescriptions [place] Controlled Substance Registry consulted? Not Applicable   Coral Spikes, DO 01/15/19 1105

## 2019-01-25 ENCOUNTER — Emergency Department
Admission: EM | Admit: 2019-01-25 | Discharge: 2019-01-25 | Disposition: A | Discharge status: Home / Self Care | Payer: Medicaid Other | Attending: Student in an Organized Health Care Education/Training Program | Admitting: Student in an Organized Health Care Education/Training Program

## 2019-01-25 ENCOUNTER — Emergency Department: Payer: Medicaid Other

## 2019-01-25 ENCOUNTER — Other Ambulatory Visit: Payer: Self-pay

## 2019-01-25 DIAGNOSIS — B9789 Other viral agents as the cause of diseases classified elsewhere: Secondary | ICD-10-CM | POA: Diagnosis not present

## 2019-01-25 DIAGNOSIS — R51 Headache: Secondary | ICD-10-CM | POA: Insufficient documentation

## 2019-01-25 DIAGNOSIS — Z87891 Personal history of nicotine dependence: Secondary | ICD-10-CM | POA: Insufficient documentation

## 2019-01-25 DIAGNOSIS — Z20822 Contact with and (suspected) exposure to covid-19: Secondary | ICD-10-CM

## 2019-01-25 DIAGNOSIS — Z20828 Contact with and (suspected) exposure to other viral communicable diseases: Secondary | ICD-10-CM | POA: Insufficient documentation

## 2019-01-25 DIAGNOSIS — J988 Other specified respiratory disorders: Secondary | ICD-10-CM | POA: Diagnosis not present

## 2019-01-25 DIAGNOSIS — Z79899 Other long term (current) drug therapy: Secondary | ICD-10-CM | POA: Diagnosis not present

## 2019-01-25 DIAGNOSIS — J449 Chronic obstructive pulmonary disease, unspecified: Secondary | ICD-10-CM | POA: Diagnosis not present

## 2019-01-25 DIAGNOSIS — R070 Pain in throat: Secondary | ICD-10-CM | POA: Diagnosis not present

## 2019-01-25 DIAGNOSIS — R509 Fever, unspecified: Secondary | ICD-10-CM | POA: Diagnosis present

## 2019-01-25 MED ORDER — GUAIFENESIN-CODEINE 100-10 MG/5ML PO SOLN: 5.0000 mL | Freq: Three times a day (TID) | ORAL | 0 refills | Status: DC | PRN

## 2019-01-25 MED ORDER — BENZONATATE 100 MG PO CAPS: ORAL_CAPSULE | ORAL | 0 refills | Status: DC

## 2019-01-25 NOTE — ED Notes (Signed)
Pt ambulatory to treatment room, no SOB noted, pt coughing several times during assessment, with stuffy nose. Pain generalized 8/10

## 2019-01-25 NOTE — ED Triage Notes (Signed)
Fever, sore throat, headache, stuffy nose, aching. Pt says she felt like she had the flu. HX of COPD, using inhaler more.

## 2019-01-25 NOTE — ED Provider Notes (Signed)
[facility] Emergency Department Provider Note ____________________________________________  Time seen: 2035  I have reviewed the triage vital signs and the nursing notes.  HISTORY  Chief Complaint  Fever  HPI Maria Silva is a 33 y.o. female presents itself to the ED for evaluation of a 1 day complaint of intermittent subjective fevers, sore throat, headache, stuffy nose, and achiness.  She reports symptoms feel like the flu at this time.  She has a history of COPD, and admits to using her inhaler more frequently.  She works in Scientist, research (medical), but denies any sick contacts, recent travel, or high risk of exposures.  Past Medical History:  Diagnosis Date  . Asthma   . Benign tumor of cervix   . COPD (chronic obstructive pulmonary disease) ([place])   . Kidney stone   . Migraine   . Ovarian tumor   . Sternal fracture   . SVT (supraventricular tachycardia) [facility])     Patient Active Problem List   Diagnosis Date Noted  . Vitamin D deficiency 10/12/2017  . Elevated cholesterol 10/12/2017    Past Surgical History:  Procedure Laterality Date  . ABDOMINAL HYSTERECTOMY    . APPENDECTOMY    . BLADDER REPAIR    . BLADDER REPAIR W/ CESAREAN SECTION    . bladder stent    . Laparascopic    . MINOR HEMORRHOIDECTOMY    . TUMOR REMOVAL      Prior to Admission medications   Medication Sig Start Date End Date Taking? Authorizing Provider  benzonatate (TESSALON PERLES) 100 MG capsule Take 1-2 tabs TID prn cough 01/25/19   Filbert Craze, Dannielle Karvonen, PA-C  budesonide-formoterol (SYMBICORT) 160-4.5 MCG/ACT inhaler Inhale 2 puffs into the lungs 2 (two) times daily. 10/07/17   Shambley, Melody N, CNM  buPROPion 450 MG TB24 Take 300 mg by mouth daily. 08/12/18   Shambley, Melody N, CNM  doxycycline (VIBRAMYCIN) 100 MG capsule Take 1 capsule (100 mg total) by mouth 2 (two) times daily. 01/13/19   Lorin Picket, PA-C  estradiol (ESTRACE) 2 MG tablet Take 1 tablet (2 mg total) by  mouth daily. 11/08/18   Shambley, Melody N, CNM  guaiFENesin-codeine 100-10 MG/5ML syrup Take 5 mLs by mouth 3 (three) times daily as needed for cough. 01/25/19   Whitleigh Garramone, Dannielle Karvonen, PA-C  mupirocin ointment (BACTROBAN) 2 % Apply 1 application topically 3 (three) times daily. 01/13/19   Lorin Picket, PA-C  nitrofurantoin, macrocrystal-monohydrate, (MACROBID) 100 MG capsule Take 1 capsule (100 mg total) by mouth daily. 06/10/18   Shambley, Melody N, CNM  ondansetron (ZOFRAN-ODT) 4 MG disintegrating tablet Take 1 tablet (4 mg total) by mouth every 8 (eight) hours as needed for nausea or vomiting. Patient not taking: Reported on 08/12/2018 04/26/18   Coral Spikes, DO  potassium chloride (K-DUR) 10 MEQ tablet Take 1 tablet (10 mEq total) by mouth daily. 08/12/18   Shambley, Melody N, CNM  progesterone (PROMETRIUM) 200 MG capsule Take 1 capsule (200 mg total) by mouth daily. 11/22/18   Shambley, Melody N, CNM  promethazine (PHENERGAN) 25 MG tablet Take 1 tablet (25 mg total) by mouth every 6 (six) hours as needed for nausea or vomiting. 05/09/18   Norval Gable, MD  topiramate (TOPAMAX) 100 MG tablet Take 1 tablet (100 mg total) by mouth daily. Pt takes 150mg  daily 08/11/18   Shambley, Melody N, CNM  Vitamin D, Ergocalciferol, (DRISDOL) 1.25 MG (50000 UT) CAPS capsule Take 1 capsule (50,000 Units total) by mouth every  7 (seven) days. 11/08/18   Shambley, Melody N, CNM    Allergies Amoxicillin, Tape, and Vicodin [hydrocodone-acetaminophen]  Family History  Adopted: Yes  Problem Relation Age of Onset  . Heart disease Maternal Grandfather   . Renal Disease Brother   . Wilson's disease Son     Social History Social History   Tobacco Use  . Smoking status: Former Research scientist (life sciences)  . Smokeless tobacco: Never Used  Substance Use Topics  . Alcohol use: Not Currently    Comment: occasionaly  . Drug use: No    Review of Systems  Constitutional: Negative for fever. Eyes: Negative for visual changes. ENT:  Positive for sore throat. Cardiovascular: Negative for chest pain. Respiratory: Negative for shortness of breath. Gastrointestinal: Negative for abdominal pain, vomiting and diarrhea. Genitourinary: Negative for dysuria. Musculoskeletal: Negative for back pain. Reports bodyaches Skin: Negative for rash. Neurological: Negative for headaches, focal weakness or numbness. ____________________________________________  PHYSICAL EXAM:  VITAL SIGNS: ED Triage Vitals  Enc Vitals Group     BP 01/25/19 1941 113/87     Pulse Rate 01/25/19 1941 97     Resp 01/25/19 1941 20     Temp 01/25/19 1941 98.7 F (37.1 C)     Temp Source 01/25/19 1941 Oral     SpO2 01/25/19 1941 98 %     Weight 01/25/19 1942 120 lb (54.4 kg)     Height 01/25/19 1942 5\' 4"  (1.626 m)     Head Circumference --      Peak Flow --      Pain Score 01/25/19 1942 8     Pain Loc --      Pain Edu? --      Excl. in [place]? --     Constitutional: Alert and oriented. Well appearing and in no distress. Head: Normocephalic and atraumatic. Eyes: Conjunctivae are normal. Normal extraocular movements Ears: Canals clear. TMs intact bilaterally. Nose: No congestion/rhinorrhea/epistaxis. Mouth/Throat: Mucous membranes are moist.  Uvula is midline and tonsils are flat.  No oral lesions noted. Neck: Supple. No thyromegaly. Hematological/Lymphatic/Immunological: No cervical lymphadenopathy. Cardiovascular: Normal rate, regular rhythm. Normal distal pulses. Respiratory: Normal respiratory effort. No wheezes/rales/rhonchi. Gastrointestinal: Soft and nontender. No distention. Musculoskeletal: Nontender with normal range of motion in all extremities.  Neurologic:  Normal gait without ataxia. Normal speech and language. No gross focal neurologic deficits are appreciated. ____________________________________________   LABS (pertinent positives/negatives) Labs Reviewed  SARS CORONAVIRUS 2  ____________________________________________    RADIOLOGY  CXR 1V  Negative  I, Claryce Friel V Bacon-Lakoda Mcanany, personally viewed and evaluated these images (plain radiographs) as part of my medical decision making, as well as reviewing the written report by the radiologist. ____________________________________________  PROCEDURES  Procedures ____________________________________________  INITIAL IMPRESSION / ASSESSMENT AND PLAN / ED COURSE  Lila Aaliya Maultsby was evaluated in Emergency Department on 01/25/2019 for the symptoms described in the history of present illness. She was evaluated in the context of the global COVID-19 pandemic, which necessitated consideration that the patient might be at risk for infection with the SARS-CoV-2 virus that causes COVID-19. Institutional protocols and algorithms that pertain to the evaluation of patients at risk for COVID-19 are in a state of rapid change based on information released by regulatory bodies including the CDC and federal and state organizations. These policies and algorithms were followed during the patient's care in the ED.  DDX: COPD exacerbation, CAP, viral URI  Patient with ED evaluation of a 1-day complaint of flu-like symptoms.  Patient clinical picture is highly concerning  for viral infection including COVID-19.  Her x-ray is negative and reassuring at this time.  I examined the vital signs have remained stable during her course in the ED.  Patient is discharged at this time with prescriptions for [facility] and guaifenesin cough syrup.  She will continue with her home medications including inhalers, allergy medicine, nasal steroids.  She will return to the ED as discussed for acutely worsening symptoms.  She will remain under house quarantine until her results are available for her pending COVID test.  A work note is provided for the remainder of this week. ____________________________________________  FINAL CLINICAL IMPRESSION(S) / ED DIAGNOSES  Final diagnoses:  Suspected  Covid-19 Virus Infection  Viral respiratory illness      Carmie End, Dannielle Karvonen, PA-C 01/25/19 2257    Merlyn Lot, MD 01/25/19 2257

## 2019-01-25 NOTE — Discharge Instructions (Addendum)
You are being treated symptomatically for suspected COVID. Take your home meds along with the prescription meds as directed. Drink fluids to prevent dehydration. Follow-up with your provider or return for worsening symptoms. You should quarantine at home until results are available.

## 2019-01-26 LAB — SARS CORONAVIRUS 2 (TAT 6-24 HRS): SARS Coronavirus 2: NEGATIVE

## 2019-03-16 ENCOUNTER — Other Ambulatory Visit: Payer: Self-pay | Admitting: Obstetrics and Gynecology

## 2019-04-02 ENCOUNTER — Telehealth: Payer: Medicaid Other | Admitting: Family

## 2019-04-02 DIAGNOSIS — N760 Acute vaginitis: Secondary | ICD-10-CM

## 2019-04-02 DIAGNOSIS — B9689 Other specified bacterial agents as the cause of diseases classified elsewhere: Secondary | ICD-10-CM

## 2019-04-02 MED ORDER — METRONIDAZOLE 500 MG PO TABS: 500.0000 mg | ORAL_TABLET | Freq: Two times a day (BID) | ORAL | 0 refills | Status: DC

## 2019-04-02 NOTE — Progress Notes (Signed)

## 2019-04-29 ENCOUNTER — Other Ambulatory Visit: Payer: Self-pay

## 2019-04-29 ENCOUNTER — Ambulatory Visit
Admission: EM | Admit: 2019-04-29 | Discharge: 2019-04-29 | Disposition: A | Discharge status: Home / Self Care | Payer: Medicaid Other | Attending: Family Medicine | Admitting: Family Medicine

## 2019-04-29 DIAGNOSIS — Z20828 Contact with and (suspected) exposure to other viral communicable diseases: Secondary | ICD-10-CM

## 2019-04-29 DIAGNOSIS — J3489 Other specified disorders of nose and nasal sinuses: Secondary | ICD-10-CM | POA: Diagnosis present

## 2019-04-29 DIAGNOSIS — R112 Nausea with vomiting, unspecified: Secondary | ICD-10-CM | POA: Diagnosis present

## 2019-04-29 DIAGNOSIS — G43011 Migraine without aura, intractable, with status migrainosus: Secondary | ICD-10-CM | POA: Diagnosis present

## 2019-04-29 DIAGNOSIS — Z20822 Contact with and (suspected) exposure to covid-19: Secondary | ICD-10-CM

## 2019-04-29 MED ORDER — ONDANSETRON 8 MG PO TBDP
8.0000 mg | ORAL_TABLET | Freq: Once | ORAL | Status: AC
  Administered 2019-04-29: 8 mg via ORAL

## 2019-04-29 NOTE — ED Triage Notes (Signed)
Patient complains of sinus pressure and migraine. Patient states that sinus issue started around 3-4 days ago and migraine started yesterday and has been worsening, has tried goodys, ibuprofen and tylenol. Patient states that this has caused her to vomit yesterday. Reports that her co-worker is positive for Covid so she was told by her boss that she needs to be tested.

## 2019-04-29 NOTE — Discharge Instructions (Addendum)
Recommend continue Zofran 8mg  every 8 hours as needed for nausea and vomiting. May continue Phenergan at night. Continue Ibuprofen 800mg  every 8 hours as needed for headache. Continue to push fluids. Rest. Stay at home. Go to the ER if headache worsens or unable to keep down any fluids. Follow up pending COVID 19 test results.

## 2019-04-30 LAB — NOVEL CORONAVIRUS, NAA (HOSP ORDER, SEND-OUT TO REF LAB; TAT 18-24 HRS): SARS-CoV-2, NAA: NOT DETECTED

## 2019-04-30 NOTE — ED Provider Notes (Signed)
Nathalie    CSN: [id number] Arrival date & time: 04/29/19  1303      History   Chief Complaint Chief Complaint  Patient presents with  . Migraine    HPI Jannis Reyna Sharratt is a 33 y.o. female.   33 year old female presents with frontal sinus pressure for the past 3 to 4 days. Minimal nasal congestion. Denies any fever, cough or diarrhea. Also experiencing a migraine headache on the left side that started yesterday and has become more constant and severe today. Has felt weak, nauseous and has vomited yesterday and today. Has taken Goody powder, Ibuprofen and Tylenol with minimal relief. A co-worker just tested positive for COVID 19 and requests testing to determine if headache is her usual migraine or could be a symptom of COVID 19. Does have a history of severe migraine headaches in which she takes Topamax daily for prevention. She has Maxalt for acute attacks but has not taken yet. She also has taken Zofran 4mg  during the day and Phenergan at night for nausea. 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 Also had injury to her bladder with the hysterectomy and has chronic UTI so on Macrobid daily. Other health issues include SVT, asthma/COPD and on Wellbutrin, Potassium, and Symbicort daily  The history is provided by the patient.    Past Medical History:  Diagnosis Date  . Asthma   . Benign tumor of cervix   . COPD (chronic obstructive pulmonary disease) ([place])   . Kidney stone   . Migraine   . Ovarian tumor   . Sternal fracture   . SVT (supraventricular tachycardia) [facility])     Patient Active Problem List   Diagnosis Date Noted  . Vitamin D deficiency 10/12/2017  . Elevated cholesterol 10/12/2017    Past Surgical History:  Procedure Laterality Date  . ABDOMINAL HYSTERECTOMY    . APPENDECTOMY    . BLADDER REPAIR    . BLADDER REPAIR W/ CESAREAN SECTION    . bladder stent    .  Laparascopic    . MINOR HEMORRHOIDECTOMY    . TUMOR REMOVAL      OB History    Gravida  3   Para      Term      Preterm      AB      Living  3     SAB      TAB      Ectopic      Multiple      Live Births  3            Home Medications    Prior to Admission medications   Medication Sig Start Date End Date Taking? Authorizing Provider  budesonide-formoterol (SYMBICORT) 160-4.5 MCG/ACT inhaler Inhale 2 puffs into the lungs 2 (two) times daily. 10/07/17  Yes Shambley, Melody N, CNM  buPROPion (WELLBUTRIN XL) 150 MG 24 hr tablet TAKE 3 TABLETS BY MOUTH ONCE DAILY 03/16/19  Yes Shambley, Melody N, CNM  buPROPion 450 MG TB24 Take 300 mg by mouth daily. 08/12/18  Yes Shambley, Melody N, CNM  estradiol (ESTRACE) 2 MG tablet Take 1 tablet (2 mg total) by mouth daily. 11/08/18  Yes Shambley, Melody N, CNM  nitrofurantoin, macrocrystal-monohydrate, (MACROBID) 100 MG capsule Take 1 capsule (100 mg total) by mouth daily. 06/10/18  Yes Shambley, Melody N, CNM  ondansetron (ZOFRAN-ODT) 4 MG disintegrating tablet Take 1 tablet (4 mg total) by  mouth every 8 (eight) hours as needed for nausea or vomiting. 04/26/18  Yes Cook, Jayce G, DO  potassium chloride (K-DUR) 10 MEQ tablet Take 1 tablet (10 mEq total) by mouth daily. 08/12/18  Yes Shambley, Melody N, CNM  progesterone (PROMETRIUM) 200 MG capsule Take 1 capsule (200 mg total) by mouth daily. 11/22/18  Yes Shambley, Melody N, CNM  promethazine (PHENERGAN) 25 MG tablet Take 1 tablet (25 mg total) by mouth every 6 (six) hours as needed for nausea or vomiting. 05/09/18  Yes Norval Gable, MD  topiramate (TOPAMAX) 100 MG tablet Take 1 tablet (100 mg total) by mouth daily. Pt takes 150mg  daily 08/11/18  Yes Shambley, Melody N, CNM  Vitamin D, Ergocalciferol, (DRISDOL) 1.25 MG (50000 UT) CAPS capsule Take 1 capsule (50,000 Units total) by mouth every 7 (seven) days. 11/08/18  Yes Shambley, Melody N, CNM    Family History Family History  Adopted: Yes   Problem Relation Age of Onset  . Heart disease Maternal Grandfather   . Renal Disease Brother   . Wilson's disease Son     Social History Social History   Tobacco Use  . Smoking status: Former Research scientist (life sciences)  . Smokeless tobacco: Never Used  Substance Use Topics  . Alcohol use: Not Currently  . Drug use: No     Allergies   Amoxicillin, Tape, and Vicodin [hydrocodone-acetaminophen]   Review of Systems Review of Systems  Constitutional: Positive for activity change, appetite change, chills and fatigue. Fever: uncertain.  HENT: Positive for sinus pressure, sinus pain and sore throat (irritated). Negative for congestion, ear discharge, ear pain, facial swelling, mouth sores, nosebleeds, postnasal drip, rhinorrhea, sneezing and trouble swallowing.   Eyes: Positive for photophobia. Negative for pain, discharge, redness, itching and visual disturbance.  Respiratory: Negative for cough, chest tightness, shortness of breath and wheezing.   Cardiovascular: Negative for chest pain and palpitations.  Gastrointestinal: Positive for nausea and vomiting. Negative for abdominal pain and diarrhea.  Musculoskeletal: Positive for arthralgias and myalgias. Negative for neck pain and neck stiffness.  Skin: Negative for color change, rash and wound.  Allergic/Immunologic: Negative for food allergies and immunocompromised state.  Neurological: Positive for light-headedness and headaches. Negative for dizziness, tremors, seizures, syncope, weakness and numbness.  Hematological: Negative for adenopathy. Does not bruise/bleed easily.     Physical Exam Triage Vital Signs ED Triage Vitals  Enc Vitals Group     BP 04/29/19 1318 109/86     Pulse Rate 04/29/19 1318 93     Resp 04/29/19 1318 18     Temp 04/29/19 1318 98.1 F (36.7 C)     Temp Source 04/29/19 1318 Oral     SpO2 04/29/19 1318 97 %     Weight 04/29/19 1316 113 lb (51.3 kg)     Height 04/29/19 1316 5\' 4"  (1.626 m)     Head Circumference --       Peak Flow --      Pain Score 04/29/19 1315 7     Pain Loc --      Pain Edu? --      Excl. in [place]? --    No data found.  Updated Vital Signs BP 109/86 (BP Location: Left Arm)   Pulse 93   Temp 98.1 F (36.7 C) (Oral)   Resp 18   Ht 5\' 4"  (1.626 m)   Wt 113 lb (51.3 kg)   SpO2 97%   BMI 19.40 kg/m   Visual Acuity Right Eye Distance:   Left  Eye Distance:   Bilateral Distance:    Right Eye Near:   Left Eye Near:    Bilateral Near:     Physical Exam Vitals signs and nursing note reviewed.  Constitutional:      General: She is awake. She is not in acute distress.    Appearance: She is well-developed, well-groomed and underweight. She is ill-appearing.     Comments: Patient is sitting comfortably in exam chair in no acute distress but appears ill and in pain.   HENT:     Head: Normocephalic and atraumatic.      Comments: Headache on left temporal to parietal area.     Right Ear: Tympanic membrane, ear canal and external ear normal. Decreased hearing (hearing aide) noted. Tympanic membrane is not injected or bulging.     Left Ear: Hearing, tympanic membrane, ear canal and external ear normal. Tympanic membrane is not injected or bulging.     Nose: No congestion or rhinorrhea.     Right Sinus: Frontal sinus tenderness present. No maxillary sinus tenderness.     Left Sinus: Frontal sinus tenderness present. No maxillary sinus tenderness.     Mouth/Throat:     Lips: Pink.     Mouth: Mucous membranes are moist.     Pharynx: Oropharynx is clear. Uvula midline. No pharyngeal swelling, oropharyngeal exudate, posterior oropharyngeal erythema or uvula swelling.  Eyes:     Extraocular Movements: Extraocular movements intact.     Conjunctiva/sclera: Conjunctivae normal.     Pupils: Pupils are equal, round, and reactive to light.  Neck:     Musculoskeletal: Normal range of motion and neck supple. No neck rigidity or muscular tenderness.     Vascular: No carotid bruit.   Cardiovascular:     Rate and Rhythm: Normal rate and regular rhythm.     Pulses: Normal pulses.     Heart sounds: Normal heart sounds. No murmur.  Pulmonary:     Effort: Pulmonary effort is normal. No respiratory distress.     Breath sounds: Normal breath sounds and air entry. No decreased air movement. No decreased breath sounds, wheezing, rhonchi or rales.  Abdominal:     General: Abdomen is flat. Bowel sounds are normal.     Palpations: Abdomen is soft.     Tenderness: There is no abdominal tenderness.  Musculoskeletal: Normal range of motion.  Lymphadenopathy:     Cervical: No cervical adenopathy.  Skin:    General: Skin is warm and dry.     Capillary Refill: Capillary refill takes less than 2 seconds.     Findings: No rash.  Neurological:     General: No focal deficit present.     Mental Status: She is alert and oriented to person, place, and time.     Cranial Nerves: Cranial nerves are intact.     Sensory: Sensation is intact. No sensory deficit.     Motor: Motor function is intact.     Coordination: Coordination is intact.     Gait: Gait is intact.  Psychiatric:        Mood and Affect: Mood normal.        Behavior: Behavior normal. Behavior is cooperative.        Thought Content: Thought content normal.        Judgment: Judgment normal.      UC Treatments / Results  Labs (all labs ordered are listed, but only abnormal results are displayed) Labs Reviewed  NOVEL CORONAVIRUS, NAA (HOSP ORDER, SEND-OUT TO REF  LAB; TAT 18-24 HRS)    EKG   Radiology No results found.  Procedures Procedures (including critical care time)  Medications Ordered in UC Medications  ondansetron (ZOFRAN-ODT) disintegrating tablet 8 mg (8 mg Oral Given 04/29/19 1425)    Initial Impression / Assessment and Plan / UC Course  I have reviewed the triage vital signs and the nursing notes.  Pertinent labs & imaging results that were available during my care of the patient were reviewed  by me and considered in my medical decision making (see chart for details).     Discussed with patient that she may be experiencing her usual migraine but can not rule out COVID 19. - sent specimen for testing. Gave Zofran 8mg  ODT now to help with nausea. May continue Zofran every 8 hours as needed (patient has sufficient supply) and Phenergan at night. Offered Toradol IM but patient declined. Recommend continue Ibuprofen 800mg  every 8 hours as needed for headache. Encouraged to take Maxalt to help with migraine. Continue to push fluids. Rest. Stay at home. Note written for work.If headache gets worse or unable to keep down any fluids, go to the ER ASAP. Otherwise, follow-up pending COVID 19 test results.  Final Clinical Impressions(s) / UC Diagnoses   Final diagnoses:  Intractable migraine without aura and with status migrainosus  Nausea and vomiting, intractability of vomiting not specified, unspecified vomiting type  Close exposure to COVID-19 virus  Sinus pressure     Discharge Instructions     Recommend continue Zofran 8mg  every 8 hours as needed for nausea and vomiting. May continue Phenergan at night. Continue Ibuprofen 800mg  every 8 hours as needed for headache. Continue to push fluids. Rest. Stay at home. Go to the ER if headache worsens or unable to keep down any fluids. Follow up pending COVID 19 test results.     ED Prescriptions    None     PDMP not reviewed this encounter.   Katy Apo, NP 04/30/19 1926

## 2019-05-02 ENCOUNTER — Telehealth: Payer: Medicaid Other | Admitting: Nurse Practitioner

## 2019-05-02 DIAGNOSIS — L03317 Cellulitis of buttock: Secondary | ICD-10-CM

## 2019-05-02 MED ORDER — SULFAMETHOXAZOLE-TRIMETHOPRIM 800-160 MG PO TABS: 1.0000 | ORAL_TABLET | Freq: Two times a day (BID) | ORAL | 0 refills | Status: DC

## 2019-05-02 NOTE — Progress Notes (Signed)
E Visit for Cellulitis  We are sorry that you are not feeling well. Here is how we plan to help!  Based on what you shared with me it looks like you have cellulitis.  Cellulitis looks like areas of skin redness, swelling, and warmth; it develops as a result of bacteria entering under the skin. Little red spots and/or bleeding can be seen in skin, and tiny surface sacs containing fluid can occur. Fever can be present. Cellulitis is almost always on one side of a body, and the lower limbs are the most common site of involvement.   I have prescribed:  Bactrim DS 1 tablet by mouth twice a day for 7 days  HOME CARE:  . Take your medications as ordered and take all of them, even if the skin irritation appears to be healing.   GET HELP RIGHT AWAY IF:  . Symptoms that don't begin to go away within 48 hours. . Severe redness persists or worsens . If the area turns color, spreads or swells. . If it blisters and opens, develops yellow-brown crust or bleeds. . You develop a fever or chills. . If the pain increases or becomes unbearable.  . Are unable to keep fluids and food down.  MAKE SURE YOU    Understand these instructions.  Will watch your condition.  Will get help right away if you are not doing well or get worse.  Thank you for choosing an e-visit. Your e-visit answers were reviewed by a board certified advanced clinical practitioner to complete your personal care plan. Depending upon the condition, your plan could have included both over the counter or prescription medications. Please review your pharmacy choice. Make sure the pharmacy is open so you can pick up prescription now. If there is a problem, you may contact your provider through [employer] and have the prescription routed to another pharmacy. Your safety is important to [place]. If you have drug allergies check your prescription carefully.  For the next 24 hours you can use MyChart to ask questions about today's visit,  request a non-urgent call back, or ask for a work or school excuse. You will get an email in the next two days asking about your experience. I hope that your e-visit has been valuable and will speed your recovery.  5-10 minutes spent reviewing and documenting in chart.

## 2019-05-23 ENCOUNTER — Other Ambulatory Visit: Payer: Self-pay

## 2019-05-23 MED ORDER — PROGESTERONE MICRONIZED 200 MG PO CAPS: 200.0000 mg | ORAL_CAPSULE | Freq: Every day | ORAL | 0 refills | Status: DC

## 2019-05-23 NOTE — Telephone Encounter (Signed)
Refill on prometrium sent per faxed request from [facility]

## 2019-06-09 DIAGNOSIS — U071 COVID-19: Secondary | ICD-10-CM

## 2019-06-09 HISTORY — DX: COVID-19: U07.1

## 2019-06-18 ENCOUNTER — Telehealth: Payer: Medicaid Other | Admitting: Family

## 2019-06-18 DIAGNOSIS — R399 Unspecified symptoms and signs involving the genitourinary system: Secondary | ICD-10-CM

## 2019-06-18 MED ORDER — NITROFURANTOIN MONOHYD MACRO 100 MG PO CAPS: 100.0000 mg | ORAL_CAPSULE | Freq: Two times a day (BID) | ORAL | 0 refills | Status: DC

## 2019-06-18 NOTE — Progress Notes (Signed)
We are sorry that you are not feeling well.  Here is how we plan to help! ° °Based on what you shared with me it looks like you most likely have a simple urinary tract infection. ° °A UTI (Urinary Tract Infection) is a bacterial infection of the bladder. ° °Most cases of urinary tract infections are simple to treat but a key part of your care is to encourage you to drink plenty of fluids and watch your symptoms carefully. ° °I have prescribed MacroBid 100 mg twice a day for 5 days.  Your symptoms should gradually improve. Call us if the burning in your urine worsens, you develop worsening fever, back pain or pelvic pain or if your symptoms do not resolve after completing the antibiotic. ° °Urinary tract infections can be prevented by drinking plenty of water to keep your body hydrated.  Also be sure when you wipe, wipe from front to back and don't hold it in!  If possible, empty your bladder every 4 hours. ° °Your e-visit answers were reviewed by a board certified advanced clinical practitioner to complete your personal care plan.  Depending on the condition, your plan could have included both over the counter or prescription medications. ° °If there is a problem please reply  once you have received a response from your provider. ° °Your safety is important to us.  If you have drug allergies check your prescription carefully.   ° °You can use MyChart to ask questions about today’s visit, request a non-urgent call back, or ask for a work or school excuse for 24 hours related to this e-Visit. If it has been greater than 24 hours you will need to follow up with your provider, or enter a new e-Visit to address those concerns. ° ° °You will get an e-mail in the next two days asking about your experience.  I hope that your e-visit has been valuable and will speed your recovery. Thank you for using e-visits. ° °Approximately 5 minutes was spent documenting and reviewing patient's chart.  ° ° ° °

## 2019-06-19 MED ORDER — SULFAMETHOXAZOLE-TRIMETHOPRIM 800-160 MG PO TABS: 1.0000 | ORAL_TABLET | Freq: Two times a day (BID) | ORAL | 0 refills | Status: DC

## 2019-06-19 NOTE — Progress Notes (Signed)
Please schedule an appointment to establish care with a new PCP. This is a condition that requires close monitoring and follow-up and e-visits are not the appropriate pathway for this.  If you need help, [name] offers a free physician referral service available at [phone].   I have prescribed Bactrim DS One tablet twice a day for 5 days.  Your symptoms should gradually improve. Call us if the burning in your urine worsens, you develop worsening fever, back pain or pelvic pain or if your symptoms do not resolve after completing the antibiotic.  Urinary tract infections can be prevented by drinking plenty of water to keep your body hydrated.  Also be sure when you wipe, wipe from front to back and don't hold it in!  If possible, empty your bladder every 4 hours.  Your e-visit answers were reviewed by a board certified advanced clinical practitioner to complete your personal care plan.  Depending on the condition, your plan could have included both over the counter or prescription medications.  If there is a problem please reply  once you have received a response from your provider.  Your safety is important to [place].  If you have drug allergies check your prescription carefully.    You can use MyChart to ask questions about today's visit, request a non-urgent call back, or ask for a work or school excuse for 24 hours related to this e-Visit. If it has been greater than 24 hours you will need to follow up with your provider, or enter a new e-Visit to address those concerns.   You will get an e-mail in the next two days asking about your experience.  I hope that your e-visit has been valuable and will speed your recovery. Thank you for using e-visits.   Greater than 5 minutes, yet less than 10 minutes of time have been spent researching, coordinating and implementing care for this patient today.

## 2019-06-19 NOTE — Addendum Note (Signed)
Addended by: Dorise Hiss on: 06/19/2019 11:30 AM   Modules accepted: Orders

## 2019-06-20 ENCOUNTER — Encounter: Payer: Self-pay | Admitting: Emergency Medicine

## 2019-06-20 ENCOUNTER — Other Ambulatory Visit: Payer: Self-pay

## 2019-06-20 ENCOUNTER — Ambulatory Visit
Admission: EM | Admit: 2019-06-20 | Discharge: 2019-06-20 | Disposition: A | Discharge status: Home / Self Care | Payer: Medicaid Other | Attending: Family Medicine | Admitting: Family Medicine

## 2019-06-20 DIAGNOSIS — R519 Headache, unspecified: Secondary | ICD-10-CM | POA: Diagnosis not present

## 2019-06-20 DIAGNOSIS — U071 COVID-19: Secondary | ICD-10-CM | POA: Insufficient documentation

## 2019-06-20 DIAGNOSIS — B349 Viral infection, unspecified: Secondary | ICD-10-CM | POA: Insufficient documentation

## 2019-06-20 DIAGNOSIS — Z20822 Contact with and (suspected) exposure to covid-19: Secondary | ICD-10-CM | POA: Diagnosis present

## 2019-06-20 DIAGNOSIS — R05 Cough: Secondary | ICD-10-CM | POA: Diagnosis not present

## 2019-06-20 NOTE — ED Triage Notes (Signed)
Pt c/o headache, sore throat, cough, abdominal pain, body aches, fatigue, weakness and loss of smell and taste. Started about a week ago.

## 2019-06-20 NOTE — Discharge Instructions (Signed)
Rest, fluids, over the counter medications as needed  

## 2019-06-20 NOTE — ED Provider Notes (Signed)
[facility]    CSN: [id number] Arrival date & time: 06/20/19  1047      History   Chief Complaint Chief Complaint  Patient presents with  . Headache  . Cough    HPI Maria Silva is a 34 y.o. female.   34 yo female with a c/o headaches, body aches, cough, fatigue, abdominal pains, loss of taste and smell for the past week. Denies any vomiting, diarrhea, chest pain or shortness of breath. Positive covid exposure.    Headache Associated symptoms: cough   Cough Associated symptoms: headaches     Past Medical History:  Diagnosis Date  . Asthma   . Benign tumor of cervix   . COPD (chronic obstructive pulmonary disease) ([place])   . Kidney stone   . Migraine   . Ovarian tumor   . Sternal fracture   . SVT (supraventricular tachycardia) [facility])     Patient Active Problem List   Diagnosis Date Noted  . Vitamin D deficiency 10/12/2017  . Elevated cholesterol 10/12/2017    Past Surgical History:  Procedure Laterality Date  . ABDOMINAL HYSTERECTOMY    . APPENDECTOMY    . BLADDER REPAIR    . BLADDER REPAIR W/ CESAREAN SECTION    . bladder stent    . Laparascopic    . MINOR HEMORRHOIDECTOMY    . TUMOR REMOVAL      OB History    Gravida  3   Para      Term      Preterm      AB      Living  3     SAB      TAB      Ectopic      Multiple      Live Births  3            Home Medications    Prior to Admission medications   Medication Sig Start Date End Date Taking? Authorizing Provider  budesonide-formoterol (SYMBICORT) 160-4.5 MCG/ACT inhaler Inhale 2 puffs into the lungs 2 (two) times daily. 10/07/17  Yes Shambley, Melody N, CNM  buPROPion (WELLBUTRIN XL) 150 MG 24 hr tablet TAKE 3 TABLETS BY MOUTH ONCE DAILY 03/16/19  Yes Shambley, Melody N, CNM  buPROPion 450 MG TB24 Take 300 mg by mouth daily. 08/12/18  Yes Shambley, Melody N, CNM  estradiol (ESTRACE) 2 MG tablet Take 1 tablet (2 mg total) by mouth daily. 11/08/18  Yes Shambley,  Melody N, CNM  potassium chloride (K-DUR) 10 MEQ tablet Take 1 tablet (10 mEq total) by mouth daily. 08/12/18  Yes Shambley, Melody N, CNM  progesterone (PROMETRIUM) 200 MG capsule Take 1 capsule (200 mg total) by mouth daily. 05/23/19  Yes Philip Aspen, CNM  promethazine (PHENERGAN) 25 MG tablet Take 1 tablet (25 mg total) by mouth every 6 (six) hours as needed for nausea or vomiting. 05/09/18  Yes Norval Gable, MD  sulfamethoxazole-trimethoprim (BACTRIM DS) 800-160 MG tablet Take 1 tablet by mouth 2 (two) times daily. 05/02/19  Yes Martin, Mary-Margaret, FNP  nitrofurantoin, macrocrystal-monohydrate, (MACROBID) 100 MG capsule Take 1 capsule (100 mg total) by mouth 2 (two) times daily. 1 po BId 06/18/19   Hawks, Alyse Low A, FNP  ondansetron (ZOFRAN-ODT) 4 MG disintegrating tablet Take 1 tablet (4 mg total) by mouth every 8 (eight) hours as needed for nausea or vomiting. 04/26/18   Coral Spikes, DO  sulfamethoxazole-trimethoprim (BACTRIM DS) 800-160 MG tablet Take 1 tablet by mouth 2 (two) times daily.  06/19/19   McVey, Gelene Mink, PA-C  topiramate (TOPAMAX) 100 MG tablet Take 1 tablet (100 mg total) by mouth daily. Pt takes 150mg  daily 08/11/18   Shambley, Melody N, CNM  Vitamin D, Ergocalciferol, (DRISDOL) 1.25 MG (50000 UT) CAPS capsule Take 1 capsule (50,000 Units total) by mouth every 7 (seven) days. 11/08/18   Joylene Igo, CNM    Family History Family History  Adopted: Yes  Problem Relation Age of Onset  . Heart disease Maternal Grandfather   . Renal Disease Brother   . Wilson's disease Son     Social History Social History   Tobacco Use  . Smoking status: Former Research scientist (life sciences)  . Smokeless tobacco: Never Used  Substance Use Topics  . Alcohol use: Not Currently  . Drug use: No     Allergies   Amoxicillin, Tape, and Vicodin [hydrocodone-acetaminophen]   Review of Systems Review of Systems  Respiratory: Positive for cough.   Neurological: Positive for headaches.      Physical Exam Triage Vital Signs ED Triage Vitals  Enc Vitals Group     BP 06/20/19 1108 110/78     Pulse Rate 06/20/19 1108 83     Resp 06/20/19 1108 18     Temp 06/20/19 1108 97.7 F (36.5 C)     Temp Source 06/20/19 1108 Oral     SpO2 06/20/19 1108 100 %     Weight 06/20/19 1105 109 lb (49.4 kg)     Height 06/20/19 1105 5\' 4"  (1.626 m)     Head Circumference --      Peak Flow --      Pain Score 06/20/19 1105 7     Pain Loc --      Pain Edu? --      Excl. in [place]? --    No data found.  Updated Vital Signs BP 110/78 (BP Location: Left Arm)   Pulse 83   Temp 97.7 F (36.5 C) (Oral)   Resp 18   Ht 5\' 4"  (1.626 m)   Wt 49.4 kg   SpO2 100%   BMI 18.71 kg/m   Visual Acuity Right Eye Distance:   Left Eye Distance:   Bilateral Distance:    Right Eye Near:   Left Eye Near:    Bilateral Near:     Physical Exam Vitals and nursing note reviewed.  Constitutional:      General: She is not in acute distress.    Appearance: She is not toxic-appearing or diaphoretic.  Cardiovascular:     Rate and Rhythm: Normal rate.  Pulmonary:     Effort: Pulmonary effort is normal. No respiratory distress.     Breath sounds: Normal breath sounds. No stridor. No wheezing, rhonchi or rales.  Neurological:     Mental Status: She is alert.      UC Treatments / Results  Labs (all labs ordered are listed, but only abnormal results are displayed) Labs Reviewed  NOVEL CORONAVIRUS, NAA (HOSP ORDER, SEND-OUT TO REF LAB; TAT 18-24 HRS)    EKG   Radiology No results found.  Procedures Procedures (including critical care time)  Medications Ordered in UC Medications - No data to display  Initial Impression / Assessment and Plan / UC Course  I have reviewed the triage vital signs and the nursing notes.  Pertinent labs & imaging results that were available during my care of the patient were reviewed by me and considered in my medical decision making (see chart for details).  Final Clinical Impressions(s) / UC Diagnoses   Final diagnoses:  Acute viral syndrome  Suspected COVID-19 virus infection     Discharge Instructions     Rest, fluids, over the counter medications as needed    ED Prescriptions    None      1. diagnosis reviewed with patient 2. Recommend supportive treatment as above 3. covid test done 4. Follow-up prn if symptoms worsen or don't improve   PDMP not reviewed this encounter.   Norval Gable, MD 06/20/19 1306

## 2019-06-22 ENCOUNTER — Telehealth (HOSPITAL_COMMUNITY): Payer: Self-pay | Admitting: Emergency Medicine

## 2019-06-22 LAB — NOVEL CORONAVIRUS, NAA (HOSP ORDER, SEND-OUT TO REF LAB; TAT 18-24 HRS): SARS-CoV-2, NAA: DETECTED — AB

## 2019-06-22 NOTE — Telephone Encounter (Signed)

## 2019-06-23 ENCOUNTER — Ambulatory Visit (INDEPENDENT_AMBULATORY_CARE_PROVIDER_SITE_OTHER): Payer: Medicaid Other | Admitting: Obstetrics and Gynecology

## 2019-06-23 ENCOUNTER — Other Ambulatory Visit: Payer: Self-pay

## 2019-06-23 ENCOUNTER — Encounter: Payer: Self-pay | Admitting: Obstetrics and Gynecology

## 2019-06-23 VITALS — BP 117/80 | HR 86 | Ht 64.0 in | Wt 114.0 lb

## 2019-06-23 DIAGNOSIS — R309 Painful micturition, unspecified: Secondary | ICD-10-CM

## 2019-06-23 DIAGNOSIS — S30814A Abrasion of vagina and vulva, initial encounter: Secondary | ICD-10-CM

## 2019-06-23 LAB — POCT URINALYSIS DIPSTICK
Bilirubin, UA: NEGATIVE
Glucose, UA: NEGATIVE
Ketones, UA: NEGATIVE
Leukocytes, UA: NEGATIVE
Nitrite, UA: NEGATIVE
Protein, UA: POSITIVE — AB
Spec Grav, UA: >=1.03 — AB (ref 1.010–1.025)
Urobilinogen, UA: 0.2 E.U./dL
pH, UA: 6 (ref 5.0–8.0)

## 2019-06-23 NOTE — Progress Notes (Signed)
GYNECOLOGY PROGRESS NOTE  Subjective:    Patient ID: Maria Silva, female    DOB: 1986/02/11, 34 y.o.   MRN: [id number]  HPI  Patient is a 34 y.o. G83P0 female who presents for complaints of vaginal pain/irritation immediately after intercourse, also near urethra. This has been going on for several weeks. Notes that has a new partner.  Is using condoms consistently.  Has been seen by Urgent Care, was treated for a UTI but still noting symptoms.  Has a h/o recurrent UTI's for which she is on suppressive therapy with Macrobid, notes this feels different.  She was previously a patient of [employer], CNM.   The following portions of the patient's history were reviewed and updated as appropriate:   She  has a past medical history of Asthma, Benign tumor of cervix, COPD (chronic obstructive pulmonary disease) ([place]), Kidney stone, Migraine, Ovarian tumor, Sternal fracture, and SVT (supraventricular tachycardia) ([place]).   She  has a past surgical history that includes Abdominal hysterectomy; Appendectomy; Bladder repair; Tumor removal; Laparascopic; Bladder repair w/ Cesarean section; Minor hemorrhoidectomy; and bladder stent.   Her family history includes Heart disease in her maternal grandfather; Renal Disease in her brother; Wilson's disease in her son. She was adopted.   She  reports that she has quit smoking. She has never used smokeless tobacco. She reports current alcohol use. She reports that she does not use drugs.   Current Outpatient Medications on File Prior to Visit  Medication Sig Dispense Refill  . budesonide-formoterol (SYMBICORT) 160-4.5 MCG/ACT inhaler Inhale 2 puffs into the lungs 2 (two) times daily. 1 Inhaler 6  . buPROPion 450 MG TB24 Take 300 mg by mouth daily. 30 tablet 6  . estradiol (ESTRACE) 2 MG tablet Take 1 tablet (2 mg total) by mouth daily. 90 tablet 3  . nitrofurantoin, macrocrystal-monohydrate, (MACROBID) 100 MG capsule Take 1 capsule (100 mg total) by  mouth 2 (two) times daily. 1 po BId 14 capsule 0  . ondansetron (ZOFRAN-ODT) 4 MG disintegrating tablet Take 1 tablet (4 mg total) by mouth every 8 (eight) hours as needed for nausea or vomiting. 20 tablet 0  . potassium chloride (K-DUR) 10 MEQ tablet Take 1 tablet (10 mEq total) by mouth daily. 30 tablet 1  . progesterone (PROMETRIUM) 200 MG capsule Take 1 capsule (200 mg total) by mouth daily. 90 capsule 0  . promethazine (PHENERGAN) 25 MG tablet Take 1 tablet (25 mg total) by mouth every 6 (six) hours as needed for nausea or vomiting. 10 tablet 0  . topiramate (TOPAMAX) 100 MG tablet Take 1 tablet (100 mg total) by mouth daily. Pt takes 150mg  daily 120 tablet 4  . Vitamin D, Ergocalciferol, (DRISDOL) 1.25 MG (50000 UT) CAPS capsule Take 1 capsule (50,000 Units total) by mouth every 7 (seven) days. 30 capsule 1   No current facility-administered medications on file prior to visit.   She is allergic to amoxicillin; tape; and vicodin [hydrocodone-acetaminophen]..   Review of Systems Pertinent items noted in HPI and remainder of comprehensive ROS otherwise negative.   Objective:   Blood pressure 117/80, pulse 86, height 5\' 4"  (1.626 m), weight 114 lb (51.7 kg). General appearance: alert and no distress Abdomen: soft, non-tender; bowel sounds normal; no masses,  no organomegaly Pelvic: external genitalia normal, rectovaginal septum normal.  Vagina without discharge, abrasion noted near urethra at introitus.  Cervix normal appearing, no lesions and no motion tenderness.      Labs:  Results for orders  placed or performed in visit on 06/23/19  POCT Urinalysis Dipstick  Result Value Ref Range   Color, UA yellow    Clarity, UA clear    Glucose, UA Negative Negative   Bilirubin, UA neg    Ketones, UA neg    Spec Grav, UA >=1.030 (A) 1.010 - 1.025   Blood, UA non hem trace    pH, UA 6.0 5.0 - 8.0   Protein, UA Positive (A) Negative   Urobilinogen, UA 0.2 0.2 or 1.0 E.U./dL   Nitrite, UA  neg    Leukocytes, UA Negative Negative   Appearance yellow;clear    Odor      Assessment:   Pain with urination  Abrasion of vagina, initial encounter  Plan:   - Patient with pain with urination, likely secondary to abrasion near urethra. Advised on pelvic rest x 1 week, can use coconut oil or petroleum jelly to area once or twice daily to facilitate healing. Discussed alternative options for use of condoms including sensitive style or lambskin (although does not protect against STDs).     Rubie Maid, MD [facility]

## 2019-06-23 NOTE — Progress Notes (Signed)
Pt present due to having vaginal irritation for a few days. Pt stated having pain/burning when she urinate and vaginal irritation after sex. UA completed and documented/

## 2019-07-12 ENCOUNTER — Other Ambulatory Visit: Payer: Self-pay

## 2019-07-12 DIAGNOSIS — R399 Unspecified symptoms and signs involving the genitourinary system: Secondary | ICD-10-CM

## 2019-07-14 ENCOUNTER — Telehealth: Payer: Self-pay

## 2019-07-14 DIAGNOSIS — R309 Painful micturition, unspecified: Secondary | ICD-10-CM

## 2019-07-14 DIAGNOSIS — R399 Unspecified symptoms and signs involving the genitourinary system: Secondary | ICD-10-CM

## 2019-07-14 MED ORDER — FLUCONAZOLE 150 MG PO TABS: 150.0000 mg | ORAL_TABLET | Freq: Once | ORAL | 0 refills | Status: AC

## 2019-07-14 MED ORDER — METRONIDAZOLE 500 MG PO TABS: 500.0000 mg | ORAL_TABLET | Freq: Two times a day (BID) | ORAL | 0 refills | Status: DC

## 2019-07-19 ENCOUNTER — Telehealth: Payer: Self-pay | Admitting: Obstetrics and Gynecology

## 2019-07-19 MED ORDER — NITROFURANTOIN MONOHYD MACRO 100 MG PO CAPS: 100.0000 mg | ORAL_CAPSULE | Freq: Two times a day (BID) | ORAL | 1 refills | Status: DC

## 2019-07-19 NOTE — Telephone Encounter (Signed)
This is a test.

## 2019-07-19 NOTE — Telephone Encounter (Signed)
Spoke with pt and refilled Macrobid for 30 days with one refill and sent in a referral to an urologist. Pt was informed that she needs to be seen by an urologist and is unable to continue taking the Macrobid daily since she has been on it continually for a year. Pt stated that she understood and was confused as well of why nothing could be done to help after all the surgeries she has had for her urinary issues.

## 2019-07-19 NOTE — Telephone Encounter (Signed)
Patient called asking about refill, she said shes been trying to get this resolved for a week and has no luck. Could you please advise this patient.  Thank you, Farrell Ours

## 2019-07-28 ENCOUNTER — Telehealth: Payer: Self-pay | Admitting: Obstetrics and Gynecology

## 2019-07-28 MED ORDER — BUPROPION HCL ER (XL) 450 MG PO TB24: 300.0000 mg | ORAL_TABLET | Freq: Every day | ORAL | 0 refills | Status: DC

## 2019-07-28 NOTE — Telephone Encounter (Signed)
Patient called saying her MyChart is showing that she has refills on her bupropion medication, and its showing in her chart on my end- however her pharmacy is showing no refills. Could you please advise this patient. She has been out of this medication for 2 days.   Thank you, TC

## 2019-07-28 NOTE — Telephone Encounter (Signed)
Pt called and informed that her medication has been refilled and sent to [facility]. Pt is aware that no refills were sent in until I speak with [facility] to see how many refills needs to be given. Pt stated that she understood.

## 2019-08-02 ENCOUNTER — Other Ambulatory Visit: Payer: Self-pay | Admitting: Obstetrics and Gynecology

## 2019-08-02 MED ORDER — BUPROPION HCL ER (XL) 450 MG PO TB24: 150.0000 mg | ORAL_TABLET | Freq: Three times a day (TID) | ORAL | 11 refills | Status: DC

## 2019-08-12 ENCOUNTER — Telehealth: Payer: Medicaid Other | Admitting: Nurse Practitioner

## 2019-08-12 DIAGNOSIS — N898 Other specified noninflammatory disorders of vagina: Secondary | ICD-10-CM

## 2019-08-12 NOTE — Progress Notes (Signed)
Based on what you shared with me it looks like you have vaginal discharge,that should be evaluated in a face to face office visit. Yeast infections ar usually white and cottage cheese looking and itches really bad. Bacterial vaginosis is usually very foul fishy smelling. Yellowish greenish discharge could mean other things. Need to have a face to fae visit so that he discharge can be looked at and treated properly.    NOTE: If you entered your credit card information for this eVisit, you will not be charged. You may see a "hold" on your card for the $35 but that hold will drop off and you will not have a charge processed.  If you are having a true medical emergency please call 911.     For an urgent face to face visit, [place] has four urgent care centers for your convenience:   . [facility]    [phone]                  Get Driving Directions  [id number] [place], [place] [address] . 10 am to 8 pm Monday-Friday . 12 pm to 8 pm Saturday-Sunday   . [facility] at [place]                  Get Driving Directions  [phone] [place], [place], [place] [address] . 8 am to 8 pm Monday-Friday . 9 am to 6 pm Saturday . 11 am to 6 pm Sunday   . [facility] at [place]                  Get Driving Directions   [address], [place] [address] . 8 am to 8 pm Monday-Friday . 8 am to 4 pm Saturday-Sunday    . [facility] at [place]                    Get Driving Directions  [id number]  [address] [place], [place] [address]  . Monday-Friday, 12 PM to 6 PM    Your e-visit answers were reviewed by a board certified advanced clinical practitioner to complete your personal care plan.  Thank you for using e-Visits.

## 2019-08-21 NOTE — Progress Notes (Incomplete)
08/22/2019 12:53 PM   Maria Silva [birth year] [id number]  Referring provider: Rubie Maid, [place],  [place] [address]  No chief complaint on file.   HPI: Maria Silva is a 34 yo white F who presents today for the evaluation and management of pain with urination. She was referred to [place] by Rubie Maid, MD.   Her UA on dip was positive for proteins otherwise unremarkable on 06/23/19.   She has hx of kidney stones.    1. ***  *** 2. *** *** 3. *** ***    PMH: Past Medical History:  Diagnosis Date  . Asthma   . Benign tumor of cervix   . COPD (chronic obstructive pulmonary disease) ([place])   . Kidney stone   . Migraine   . Ovarian tumor   . Sternal fracture   . SVT (supraventricular tachycardia) [facility])     Surgical History: Past Surgical History:  Procedure Laterality Date  . ABDOMINAL HYSTERECTOMY    . APPENDECTOMY    . BLADDER REPAIR    . BLADDER REPAIR W/ CESAREAN SECTION    . bladder stent    . Laparascopic    . MINOR HEMORRHOIDECTOMY    . TUMOR REMOVAL      Home Medications:  Allergies as of 08/22/2019      Reactions   Amoxicillin Nausea And Vomiting, Rash   Tape Rash   Vicodin [hydrocodone-acetaminophen] Nausea And Vomiting, Rash      Medication List       Accurate as of August 21, 2019 12:53 PM. If you have any questions, ask your nurse or doctor.        budesonide-formoterol 160-4.5 MCG/ACT inhaler Commonly known as: SYMBICORT Inhale 2 puffs into the lungs 2 (two) times daily.   buPROPion HCl ER (XL) 450 MG Tb24 Take 150 mg by mouth in the morning, at noon, and at bedtime.   estradiol 2 MG tablet Commonly known as: ESTRACE Take 1 tablet (2 mg total) by mouth daily.   nitrofurantoin (macrocrystal-monohydrate) 100 MG capsule Commonly known as: Macrobid Take 1 capsule (100 mg total) by mouth 2 (two) times daily. 1 po BId   ondansetron 4 MG disintegrating tablet Commonly known as:  ZOFRAN-ODT Take 1 tablet (4 mg total) by mouth every 8 (eight) hours as needed for nausea or vomiting.   potassium chloride 10 MEQ tablet Commonly known as: KLOR-CON Take 1 tablet (10 mEq total) by mouth daily.   progesterone 200 MG capsule Commonly known as: PROMETRIUM Take 1 capsule (200 mg total) by mouth daily.   promethazine 25 MG tablet Commonly known as: PHENERGAN Take 1 tablet (25 mg total) by mouth every 6 (six) hours as needed for nausea or vomiting.   topiramate 100 MG tablet Commonly known as: TOPAMAX Take 1 tablet (100 mg total) by mouth daily. Pt takes 150mg  daily   Vitamin D (Ergocalciferol) 1.25 MG (50000 UNIT) Caps capsule Commonly known as: DRISDOL Take 1 capsule (50,000 Units total) by mouth every 7 (seven) days.       Allergies:  Allergies  Allergen Reactions  . Amoxicillin Nausea And Vomiting and Rash  . Tape Rash  . Vicodin [Hydrocodone-Acetaminophen] Nausea And Vomiting and Rash    Family History: Family History  Adopted: Yes  Problem Relation Age of Onset  . Heart disease Maternal Grandfather   . Renal Disease Brother   . Wilson's disease Son     Social History:  reports that she  has quit smoking. She has never used smokeless tobacco. She reports current alcohol use. She reports that she does not use drugs.   Physical Exam: There were no vitals taken for this visit.  Constitutional:  Alert and oriented, No acute distress. HEENT: [place] AT, moist mucus membranes.  Trachea midline, no masses. Cardiovascular: No clubbing, cyanosis, or edema. Respiratory: Normal respiratory effort, no increased work of breathing. GI: Abdomen is soft, nontender, nondistended, no abdominal masses GU: No CVA tenderness Lymph: No cervical or inguinal lymphadenopathy. Skin: No rashes, bruises or suspicious lesions. Neurologic: Grossly intact, no focal deficits, moving all 4 extremities. Psychiatric: Normal mood and affect.  Laboratory  Data:  Urinalysis  Pertinent Imaging:   Assessment & Plan:    @DIAGMED @  No follow-ups on file.  [facility] [address] [place], [place] [address] [phone]  I, Lucas Mallow, am acting as a scribe for Dr. Hollice Espy,  {Add Scribe Attestation Statement}

## 2019-08-22 ENCOUNTER — Encounter: Payer: Self-pay | Admitting: Urology

## 2019-08-22 ENCOUNTER — Ambulatory Visit: Payer: Medicaid Other | Admitting: Urology

## 2019-09-06 ENCOUNTER — Other Ambulatory Visit: Payer: Self-pay

## 2019-09-06 ENCOUNTER — Ambulatory Visit
Admission: EM | Admit: 2019-09-06 | Discharge: 2019-09-06 | Disposition: A | Discharge status: Home / Self Care | Payer: Medicaid Other | Attending: Family Medicine | Admitting: Family Medicine

## 2019-09-06 DIAGNOSIS — Z79899 Other long term (current) drug therapy: Secondary | ICD-10-CM | POA: Insufficient documentation

## 2019-09-06 DIAGNOSIS — R05 Cough: Secondary | ICD-10-CM

## 2019-09-06 DIAGNOSIS — J449 Chronic obstructive pulmonary disease, unspecified: Secondary | ICD-10-CM | POA: Diagnosis not present

## 2019-09-06 DIAGNOSIS — B349 Viral infection, unspecified: Secondary | ICD-10-CM

## 2019-09-06 DIAGNOSIS — Z87891 Personal history of nicotine dependence: Secondary | ICD-10-CM | POA: Diagnosis not present

## 2019-09-06 DIAGNOSIS — E7800 Pure hypercholesterolemia, unspecified: Secondary | ICD-10-CM | POA: Insufficient documentation

## 2019-09-06 DIAGNOSIS — M791 Myalgia, unspecified site: Secondary | ICD-10-CM

## 2019-09-06 DIAGNOSIS — R197 Diarrhea, unspecified: Secondary | ICD-10-CM | POA: Diagnosis not present

## 2019-09-06 DIAGNOSIS — Z7951 Long term (current) use of inhaled steroids: Secondary | ICD-10-CM | POA: Insufficient documentation

## 2019-09-06 DIAGNOSIS — Z20822 Contact with and (suspected) exposure to covid-19: Secondary | ICD-10-CM | POA: Diagnosis not present

## 2019-09-06 LAB — INFLUENZA PANEL BY PCR (TYPE A & B)
Influenza A By PCR: NEGATIVE
Influenza B By PCR: NEGATIVE

## 2019-09-06 NOTE — ED Provider Notes (Signed)
[facility]    CSN: [id number] Arrival date & time: 09/06/19  1230      History   Chief Complaint Chief Complaint  Patient presents with  . Generalized Body Aches  . Diarrhea  . Cough    HPI Maria Silva is a 34 y.o. female.   34 yo female with a c/o nausea and vomiting last week (now resolved), continuing intermittent diarrhea and runny nose, nasal congestion, cough and body aches for the past 2 days. Denies any fevers, chills, chest pains, shortness of breath, melena, hematochezia. No known sick contacts. Patient had covid 2 months ago.      Past Medical History:  Diagnosis Date  . Asthma   . Benign tumor of cervix   . COPD (chronic obstructive pulmonary disease) ([place])   . COVID-19 06/2019  . Kidney stone   . Migraine   . Ovarian tumor   . Sternal fracture   . SVT (supraventricular tachycardia) [facility])     Patient Active Problem List   Diagnosis Date Noted  . Vitamin D deficiency 10/12/2017  . Elevated cholesterol 10/12/2017    Past Surgical History:  Procedure Laterality Date  . ABDOMINAL HYSTERECTOMY    . APPENDECTOMY    . BLADDER REPAIR    . BLADDER REPAIR W/ CESAREAN SECTION    . bladder stent    . Laparascopic    . MINOR HEMORRHOIDECTOMY    . TUMOR REMOVAL      OB History    Gravida  3   Para      Term      Preterm      AB      Living  3     SAB      TAB      Ectopic      Multiple      Live Births  3            Home Medications    Prior to Admission medications   Medication Sig Start Date End Date Taking? Authorizing Provider  albuterol (VENTOLIN HFA) 108 (90 Base) MCG/ACT inhaler Inhale 2 puffs into the lungs every 6 (six) hours as needed for wheezing or shortness of breath.   Yes [provider]  budesonide-formoterol (SYMBICORT) 160-4.5 MCG/ACT inhaler Inhale 2 puffs into the lungs 2 (two) times daily. 10/07/17  Yes Shambley, Melody N, CNM  buPROPion HCl ER, XL, 450 MG TB24 Take 150 mg by  mouth in the morning, at noon, and at bedtime. 08/02/19  Yes Rubie Maid, MD  estradiol (ESTRACE) 2 MG tablet Take 1 tablet (2 mg total) by mouth daily. 11/08/18  Yes Shambley, Melody N, CNM  nitrofurantoin, macrocrystal-monohydrate, (MACROBID) 100 MG capsule Take 1 capsule (100 mg total) by mouth 2 (two) times daily. 1 po BId 07/19/19  Yes Rubie Maid, MD  ondansetron (ZOFRAN-ODT) 4 MG disintegrating tablet Take 1 tablet (4 mg total) by mouth every 8 (eight) hours as needed for nausea or vomiting. 04/26/18  Yes Cook, Jayce G, DO  potassium chloride (K-DUR) 10 MEQ tablet Take 1 tablet (10 mEq total) by mouth daily. 08/12/18  Yes Shambley, Melody N, CNM  progesterone (PROMETRIUM) 200 MG capsule Take 1 capsule (200 mg total) by mouth daily. 05/23/19  Yes Philip Aspen, CNM  promethazine (PHENERGAN) 25 MG tablet Take 1 tablet (25 mg total) by mouth every 6 (six) hours as needed for nausea or vomiting. 05/09/18  Yes Norval Gable, MD  topiramate (TOPAMAX) 100 MG tablet Take  1 tablet (100 mg total) by mouth daily. Pt takes 150mg  daily 08/11/18  Yes Shambley, Melody N, CNM  Vitamin D, Ergocalciferol, (DRISDOL) 1.25 MG (50000 UT) CAPS capsule Take 1 capsule (50,000 Units total) by mouth every 7 (seven) days. 11/08/18  Yes Shambley, Melody N, CNM    Family History Family History  Adopted: Yes  Problem Relation Age of Onset  . Heart disease Maternal Grandfather   . Renal Disease Brother   . Wilson's disease Son     Social History Social History   Tobacco Use  . Smoking status: Former Research scientist (life sciences)  . Smokeless tobacco: Never Used  Substance Use Topics  . Alcohol use: Yes    Comment: occass  . Drug use: No     Allergies   Amoxicillin, Tape, and Vicodin [hydrocodone-acetaminophen]   Review of Systems Review of Systems   Physical Exam Triage Vital Signs ED Triage Vitals  Enc Vitals Group     BP 09/06/19 1258 104/84     Pulse Rate 09/06/19 1258 83     Resp 09/06/19 1258 18     Temp 09/06/19  1258 98.2 F (36.8 C)     Temp Source 09/06/19 1258 Oral     SpO2 09/06/19 1258 99 %     Weight 09/06/19 1256 114 lb (51.7 kg)     Height 09/06/19 1256 5' 4.5" (1.638 m)     Head Circumference --      Peak Flow --      Pain Score 09/06/19 1255 8     Pain Loc --      Pain Edu? --      Excl. in [place]? --    No data found.  Updated Vital Signs BP 104/84 (BP Location: Left Arm)   Pulse 83   Temp 98.2 F (36.8 C) (Oral)   Resp 18   Ht 5' 4.5" (1.638 m)   Wt 51.7 kg   SpO2 99%   BMI 19.27 kg/m   Visual Acuity Right Eye Distance:   Left Eye Distance:   Bilateral Distance:    Right Eye Near:   Left Eye Near:    Bilateral Near:     Physical Exam Vitals and nursing note reviewed.  Constitutional:      General: She is not in acute distress.    Appearance: She is not toxic-appearing or diaphoretic.  Cardiovascular:     Rate and Rhythm: Normal rate.     Heart sounds: Normal heart sounds.  Pulmonary:     Effort: Pulmonary effort is normal. No respiratory distress.     Breath sounds: Normal breath sounds. No stridor. No wheezing, rhonchi or rales.  Neurological:     Mental Status: She is alert.      UC Treatments / Results  Labs (all labs ordered are listed, but only abnormal results are displayed) Labs Reviewed  SARS CORONAVIRUS 2 (TAT 6-24 HRS)  INFLUENZA PANEL BY PCR (TYPE A & B)    EKG   Radiology No results found.  Procedures Procedures (including critical care time)  Medications Ordered in UC Medications - No data to display  Initial Impression / Assessment and Plan / UC Course  I have reviewed the triage vital signs and the nursing notes.  Pertinent labs & imaging results that were available during my care of the patient were reviewed by me and considered in my medical decision making (see chart for details).      Final Clinical Impressions(s) / UC Diagnoses  Final diagnoses:  Viral illness     Discharge Instructions     Rest, fluids,  over the counter medications as needed    ED Prescriptions    None      1. Lab results and diagnosis reviewed with patient 2. Recommend supportive treatment with rest, increase fluids, otc meds prn 3. Await covid test 4. Follow-up prn if symptoms worsen or don't improve   PDMP not reviewed this encounter.   Norval Gable, MD 09/06/19 [phone]

## 2019-09-06 NOTE — Discharge Instructions (Signed)
Rest, fluids, over the counter medications as needed  

## 2019-09-06 NOTE — ED Triage Notes (Signed)
Pt presents with c/o emesis, diarrhea, body aches and headache last week. Pt reports emesis has subsided but headache, diarrhea and body aches have continued. Pt also report nasal congestion, cough and fatigue.

## 2019-09-07 LAB — SARS CORONAVIRUS 2 (TAT 6-24 HRS): SARS Coronavirus 2: NEGATIVE

## 2019-09-14 ENCOUNTER — Other Ambulatory Visit: Payer: Self-pay | Admitting: Certified Nurse Midwife

## 2019-09-14 ENCOUNTER — Other Ambulatory Visit: Payer: Self-pay | Admitting: Obstetrics and Gynecology

## 2019-09-14 DIAGNOSIS — R399 Unspecified symptoms and signs involving the genitourinary system: Secondary | ICD-10-CM

## 2019-09-15 ENCOUNTER — Other Ambulatory Visit: Payer: Self-pay

## 2019-09-15 ENCOUNTER — Other Ambulatory Visit: Payer: Self-pay | Admitting: Certified Nurse Midwife

## 2019-09-15 MED ORDER — TOPIRAMATE 100 MG PO TABS: 100.0000 mg | ORAL_TABLET | Freq: Every day | ORAL | 0 refills | Status: DC

## 2019-09-15 NOTE — Telephone Encounter (Signed)
Received fax from [facility]- topiramate 100mg  #60 x 0 refill sent

## 2019-11-14 ENCOUNTER — Other Ambulatory Visit: Payer: Self-pay | Admitting: Certified Nurse Midwife

## 2019-11-22 ENCOUNTER — Telehealth: Payer: Self-pay | Admitting: Obstetrics and Gynecology

## 2019-11-22 NOTE — Telephone Encounter (Signed)
Patient called in saying Dr. Marcelline Mates refilled a prescription but sent in the wrong dosage. Patient states she needed 150 mg instead of 100 mg. Could you please advise?

## 2019-11-23 ENCOUNTER — Other Ambulatory Visit: Payer: Self-pay

## 2019-11-24 NOTE — Addendum Note (Signed)
Addended by: Edwyna Shell on: 11/24/2019 05:46 PM   Modules accepted: Orders

## 2019-11-24 NOTE — Telephone Encounter (Signed)
Spoke with pt concerning her medication error. Pt stated that she was taking 150 mg of Topamax. She was taking 1/2 tablet in the morning and whole tablet at night. Please advise on if you would like to continue this as informed by pt.

## 2019-11-24 NOTE — Telephone Encounter (Signed)
Please advise. Thanks Noami Bove 

## 2019-11-27 ENCOUNTER — Telehealth: Payer: Medicaid Other | Admitting: Family

## 2019-11-27 DIAGNOSIS — N390 Urinary tract infection, site not specified: Secondary | ICD-10-CM | POA: Diagnosis not present

## 2019-11-27 DIAGNOSIS — A499 Bacterial infection, unspecified: Secondary | ICD-10-CM

## 2019-11-27 MED ORDER — NITROFURANTOIN MONOHYD MACRO 100 MG PO CAPS: 100.0000 mg | ORAL_CAPSULE | Freq: Two times a day (BID) | ORAL | 0 refills | Status: DC

## 2019-11-27 NOTE — Progress Notes (Signed)

## 2019-11-27 NOTE — Telephone Encounter (Signed)
Yes ma'am it was there I deleted it when she stated that the pharamcy was wrong and needed to be sent in as Topamax 150 mg. Sorry. Luana Shu

## 2019-11-28 ENCOUNTER — Telehealth: Payer: Self-pay | Admitting: Obstetrics and Gynecology

## 2019-11-28 MED ORDER — TOPIRAMATE 100 MG PO TABS: 150.0000 mg | ORAL_TABLET | Freq: Every day | ORAL | 0 refills | Status: DC

## 2019-11-28 NOTE — Addendum Note (Signed)
Addended by: Augusto Gamble on: 11/28/2019 02:17 PM   Modules accepted: Orders

## 2019-11-28 NOTE — Telephone Encounter (Signed)
Please inform patient that I will send in a 3 month supply until she comes in to see one of the providers at [facility] for her annual exam. Also, if she has a PCP, they can refill this medication as well.

## 2019-11-28 NOTE — Telephone Encounter (Signed)
Pt called in and stated she needs a refill on her estradiol and vitamin D sent to [facility]. Please advise

## 2019-11-29 MED ORDER — ESTRADIOL 2 MG PO TABS: 2.0000 mg | ORAL_TABLET | Freq: Every day | ORAL | 0 refills | Status: DC

## 2019-11-29 NOTE — Telephone Encounter (Signed)
Informed pt that [facility] had refilled her medication for topamax and sent it to [place].

## 2019-11-29 NOTE — Addendum Note (Signed)
Addended by: Edwyna Shell on: 11/29/2019 04:42 PM   Modules accepted: Orders

## 2019-11-30 NOTE — Telephone Encounter (Signed)
Pt called no answer unable to LM due to VM being full. Was calling pt to inform that the medication estradiol was sent in to [place].

## 2019-12-07 ENCOUNTER — Telehealth: Payer: Medicaid Other | Admitting: Nurse Practitioner

## 2019-12-07 DIAGNOSIS — N760 Acute vaginitis: Secondary | ICD-10-CM | POA: Diagnosis not present

## 2019-12-07 DIAGNOSIS — B9689 Other specified bacterial agents as the cause of diseases classified elsewhere: Secondary | ICD-10-CM

## 2019-12-07 MED ORDER — METRONIDAZOLE 500 MG PO TABS: 500.0000 mg | ORAL_TABLET | Freq: Two times a day (BID) | ORAL | 0 refills | Status: DC

## 2019-12-07 NOTE — Progress Notes (Signed)
We are sorry that you are not feeling well. Here is how we plan to help! Based on what you shared with me it looks like you: May have a vaginosis due to bacteria  Vaginosis is an inflammation of the vagina that can result in discharge, itching and pain. The cause is usually a change in the normal balance of vaginal bacteria or an infection. Vaginosis can also result from reduced estrogen levels after menopause.  The most common causes of vaginosis are:   Bacterial vaginosis which results from an overgrowth of one on several organisms that are normally present in your vagina.   Yeast infections which are caused by a naturally occurring fungus called candida.   Vaginal atrophy (atrophic vaginosis) which results from the thinning of the vagina from reduced estrogen levels after menopause.   Trichomoniasis which is caused by a parasite and is commonly transmitted by sexual intercourse.  Factors that increase your risk of developing vaginosis include: Marland Kitchen Medications, such as antibiotics and steroids . Uncontrolled diabetes . Use of hygiene products such as bubble bath, vaginal spray or vaginal deodorant . Douching . Wearing damp or tight-fitting clothing . Using an intrauterine device (IUD) for birth control . Hormonal changes, such as those associated with pregnancy, birth control pills or menopause . Sexual activity . Having a sexually transmitted infection  Your treatment plan is Metronidazole or Flagyl 500mg  twice a day for 7 days.  I have electronically sent this prescription into the pharmacy that you have chosen.   It will be okay to take both antibiotics at the same time. The macrobid will not treat bacterial vaginosis and Flagyl will not treat a UTI.  Be sure to take all of the medication as directed. Stop taking any medication if you develop a rash, tongue swelling or shortness of breath. Mothers who are breast feeding should consider pumping and discarding their breast milk while  on these antibiotics. However, there is no consensus that infant exposure at these doses would be harmful.  Remember that medication creams can weaken latex condoms. Marland Kitchen   HOME CARE:  Good hygiene may prevent some types of vaginosis from recurring and may relieve some symptoms:  . Avoid baths, hot tubs and whirlpool spas. Rinse soap from your outer genital area after a shower, and dry the area well to prevent irritation. Don't use scented or harsh soaps, such as those with deodorant or antibacterial action. Marland Kitchen Avoid irritants. These include scented tampons and pads. . Wipe from front to back after using the toilet. Doing so avoids spreading fecal bacteria to your vagina.  Other things that may help prevent vaginosis include:  Marland Kitchen Don't douche. Your vagina doesn't require cleansing other than normal bathing. Repetitive douching disrupts the normal organisms that reside in the vagina and can actually increase your risk of vaginal infection. Douching won't clear up a vaginal infection. . Use a latex condom. Both female and female latex condoms may help you avoid infections spread by sexual contact. . Wear cotton underwear. Also wear pantyhose with a cotton crotch. If you feel comfortable without it, skip wearing underwear to bed. Yeast thrives in [employer] Your symptoms should improve in the next day or two.  GET HELP RIGHT AWAY IF:  . You have pain in your lower abdomen ( pelvic area or over your ovaries) . You develop nausea or vomiting . You develop a fever . Your discharge changes or worsens . You have persistent pain with intercourse . You develop shortness of  breath, a rapid pulse, or you faint.  These symptoms could be signs of problems or infections that need to be evaluated by a medical provider now.  MAKE SURE YOU    Understand these instructions.  Will watch your condition.  Will get help right away if you are not doing well or get worse.  Your e-visit answers were  reviewed by a board certified advanced clinical practitioner to complete your personal care plan. Depending upon the condition, your plan could have included both over the counter or prescription medications. Please review your pharmacy choice to make sure that you have choses a pharmacy that is open for you to pick up any needed prescription, Your safety is important to [place]. If you have drug allergies check your prescription carefully.   You can use MyChart to ask questions about today's visit, request a non-urgent call back, or ask for a work or school excuse for 24 hours related to this e-Visit. If it has been greater than 24 hours you will need to follow up with your provider, or enter a new e-Visit to address those concerns. You will get a MyChart message within the next two days asking about your experience. I hope that your e-visit has been valuable and will speed your recovery.  5-10 minutes spent reviewing and documenting in chart.

## 2019-12-08 NOTE — Telephone Encounter (Signed)
Medication was refilled on 11/29/2019. Sent pt a Pharmacist, community message stating that she would have to come in the office to have Vit D levels checked before any refills of Vit D be given.

## 2020-01-29 ENCOUNTER — Other Ambulatory Visit: Payer: Self-pay | Admitting: Obstetrics and Gynecology

## 2020-02-05 ENCOUNTER — Telehealth: Payer: Medicaid Other | Admitting: Emergency Medicine

## 2020-02-05 DIAGNOSIS — R300 Dysuria: Secondary | ICD-10-CM | POA: Diagnosis not present

## 2020-02-05 MED ORDER — NITROFURANTOIN MONOHYD MACRO 100 MG PO CAPS: 100.0000 mg | ORAL_CAPSULE | Freq: Two times a day (BID) | ORAL | 0 refills | Status: DC

## 2020-02-05 NOTE — Progress Notes (Signed)
We are sorry that you are not feeling well.  Here is how we plan to help!  Based on what you shared with me it looks like you most likely have a simple urinary tract infection.  A UTI (Urinary Tract Infection) is a bacterial infection of the bladder.  Most cases of urinary tract infections are simple to treat but a key part of your care is to encourage you to drink plenty of fluids and watch your symptoms carefully.  I have prescribed MacroBid 100 mg twice a day for 5 days.  Your symptoms should gradually improve. Call us if the burning in your urine worsens, you develop worsening fever, back pain or pelvic pain or if your symptoms do not resolve after completing the antibiotic.  Urinary tract infections can be prevented by drinking plenty of water to keep your body hydrated.  Also be sure when you wipe, wipe from front to back and don't hold it in!  If possible, empty your bladder every 4 hours.  Your e-visit answers were reviewed by a board certified advanced clinical practitioner to complete your personal care plan.  Depending on the condition, your plan could have included both over the counter or prescription medications.  If there is a problem please reply  once you have received a response from your provider.  Your safety is important to us.  If you have drug allergies check your prescription carefully.    You can use MyChart to ask questions about today's visit, request a non-urgent call back, or ask for a work or school excuse for 24 hours related to this e-Visit. If it has been greater than 24 hours you will need to follow up with your provider, or enter a new e-Visit to address those concerns.   You will get an e-mail in the next two days asking about your experience.  I hope that your e-visit has been valuable and will speed your recovery. Thank you for using e-visits.   **Please do not respond to this message unless you have follow up questions.** Greater than 5 but less than 10  minutes spent researching, coordinating, and implementing care for this patient today  

## 2020-02-21 ENCOUNTER — Telehealth: Payer: Medicaid Other | Admitting: Nurse Practitioner

## 2020-02-21 DIAGNOSIS — R300 Dysuria: Secondary | ICD-10-CM

## 2020-02-21 NOTE — Progress Notes (Signed)
Based on what you shared with me it looks like you have unresolved UTI,that should be evaluated in a face to face office visit. According bto your chart you were just treated on 02/05/20 for same issue that must not have resolved. You will need a face to face visit and have a urinalysis and urine culture done.    NOTE: If you entered your credit card information for this eVisit, you will not be charged. You may see a "hold" on your card for the $35 but that hold will drop off and you will not have a charge processed.  If you are having a true medical emergency please call 911.     For an urgent face to face visit, [place] has four urgent care centers for your convenience:   . [facility]    [phone]                  Get Driving Directions  [address], [place] [address] . 10 am to 8 pm Monday-Friday . 12 pm to 8 pm Saturday-Sunday   . [facility] at [place]                  Get Driving Directions  [address], [place], [place] [address] . 8 am to 8 pm Monday-Friday . 9 am to 6 pm Saturday . 11 am to 6 pm Sunday   . [facility] at [place]                  Get Driving Directions   [address] [place], [place] [address] . 8 am to 8 pm Monday-Friday . 8 am to 4 pm Saturday-Sunday    . [facility] at [place]                    Get Driving Directions  [phone]  [address] [place], [place] [address]  . Monday-Friday, 12 PM to 6 PM    Your e-visit answers were reviewed by a board certified advanced clinical practitioner to complete your personal care plan.  Thank you for using e-Visits.

## 2020-02-27 ENCOUNTER — Other Ambulatory Visit: Payer: Self-pay | Admitting: Obstetrics and Gynecology

## 2020-02-28 ENCOUNTER — Other Ambulatory Visit: Payer: Self-pay | Admitting: Certified Nurse Midwife

## 2020-03-27 ENCOUNTER — Other Ambulatory Visit: Payer: Self-pay | Admitting: Certified Nurse Midwife

## 2020-03-27 ENCOUNTER — Other Ambulatory Visit: Payer: Self-pay | Admitting: Obstetrics and Gynecology

## 2020-04-08 ENCOUNTER — Telehealth: Payer: Self-pay

## 2020-04-08 NOTE — Telephone Encounter (Signed)
Pharmacist from [facility] in [place] called in stating that this patient had requested a refill for Progesterone 200 MG capsules however she is new to his pharmacy and he has never filled it before. Pharmacist would like a call back at [phone]. Could you please advise?

## 2020-04-09 NOTE — Telephone Encounter (Signed)
Pharmacy called in again on behalf of this patients Rx for the 200 MG progesterone capsules. Informed pharmacist we typically allow 24-48 hours for the providers to get tasks completed, he understood. Could you please advise?

## 2020-04-12 NOTE — Telephone Encounter (Signed)
Spoke to [facility] in [place] concerning the pt's medication. Informed pharmacy that her last pharmacy was informed at her last refill in Sept that she needed an appointment before anymore refills be given. Pharmacy stated that they would inform pt.

## 2020-04-14 ENCOUNTER — Other Ambulatory Visit: Payer: Self-pay

## 2020-04-15 NOTE — Telephone Encounter (Signed)
Pt called in and stated that she needs a refill on her progesterone. I saw the message from the nurse and told her we will need to  Make an appt in order for a refill. The pt was requesting a televisit, I told the pt I will have to send a message if they will do a telelvisit for this med refill. The pt verbally understood please advise

## 2020-06-12 ENCOUNTER — Other Ambulatory Visit: Payer: Self-pay

## 2020-06-12 ENCOUNTER — Ambulatory Visit (INDEPENDENT_AMBULATORY_CARE_PROVIDER_SITE_OTHER): Payer: Medicaid Other

## 2020-06-12 ENCOUNTER — Ambulatory Visit
Admission: EM | Admit: 2020-06-12 | Discharge: 2020-06-12 | Disposition: A | Discharge status: Home / Self Care | Payer: Medicaid Other | Attending: Emergency Medicine | Admitting: Emergency Medicine

## 2020-06-12 DIAGNOSIS — N390 Urinary tract infection, site not specified: Secondary | ICD-10-CM | POA: Insufficient documentation

## 2020-06-12 DIAGNOSIS — R079 Chest pain, unspecified: Secondary | ICD-10-CM

## 2020-06-12 DIAGNOSIS — S20212A Contusion of left front wall of thorax, initial encounter: Secondary | ICD-10-CM | POA: Insufficient documentation

## 2020-06-12 LAB — URINALYSIS, COMPLETE (UACMP) WITH MICROSCOPIC
Bilirubin Urine: NEGATIVE
Glucose, UA: NEGATIVE mg/dL
Hgb urine dipstick: NEGATIVE
Ketones, ur: NEGATIVE mg/dL
Leukocytes,Ua: NEGATIVE
Nitrite: POSITIVE — AB
Protein, ur: NEGATIVE mg/dL
RBC / HPF: NONE SEEN RBC/hpf (ref 0–5)
Specific Gravity, Urine: 1.025 (ref 1.005–1.030)
pH: 7 (ref 5.0–8.0)

## 2020-06-12 MED ORDER — SULFAMETHOXAZOLE-TRIMETHOPRIM 800-160 MG PO TABS: 1.0000 | ORAL_TABLET | Freq: Two times a day (BID) | ORAL | 0 refills | Status: AC

## 2020-06-12 NOTE — Discharge Instructions (Addendum)
Take the Bactrim twice daily for 7 days.  Increase your oral water intake so you increase your urine production to flush your urinary system.  You can use over-the-counter Tylenol and ibuprofen as needed for your chest wall pain.  You can also apply moist heat for 20 minutes at a time 2-3 times a day.  If your UTI symptoms persist follow-up with your primary care provider.

## 2020-06-12 NOTE — ED Triage Notes (Signed)
Patient states that she has been urinary frequency and urgency x 1 month. States that she was diagnosed with a UTI 6 weeks ago and finished an antibiotic. States that after finishing the medication her symptoms returned.   States that she fell Saturday evening while walking up her steps to her appt. States that she fell and hit her chest on the step, reports that she has had shortness of breath and can't lift heavy things.

## 2020-06-12 NOTE — ED Provider Notes (Signed)
[facility]    CSN: [id number] Arrival date & time: 06/12/20  1417      History   Chief Complaint Chief Complaint  Patient presents with  . Urinary Frequency  . Fall    HPI Maria Silva is a 35 y.o. female.   HPI   35 year old female here for evaluation of multiple complaints.  Complaint #1: Patient is complaining of urinary urgency and frequency with a foul odor x1 month.  Patient reports that she was treated for UTI that was E. coli +6 weeks ago, finished her antibiotic course, and states that the symptoms immediately returned.  Patient has a history of recurrent UTIs status post multiple bladder repairs and ureteral stent placements for kidney stones and also bladder stone removal.  Complain #2: Patient is complaining of left sided chest pain after slipping and falling on to concrete steps 4 days ago.  Patient reports that she has had some associated shortness of breath but she denies cough or coughing up blood.  Patient is also reporting a 16 pound weight loss in the last 2 months.  Past Medical History:  Diagnosis Date  . Asthma   . Benign tumor of cervix   . COPD (chronic obstructive pulmonary disease) (HCC)   . COVID-19 06/2019  . Kidney stone   . Migraine   . Ovarian tumor   . Sternal fracture   . SVT (supraventricular tachycardia) [facility])     Patient Active Problem List   Diagnosis Date Noted  . Vitamin D deficiency 10/12/2017  . Elevated cholesterol 10/12/2017    Past Surgical History:  Procedure Laterality Date  . ABDOMINAL HYSTERECTOMY    . APPENDECTOMY    . BLADDER REPAIR    . BLADDER REPAIR W/ CESAREAN SECTION    . bladder stent    . Laparascopic    . MINOR HEMORRHOIDECTOMY    . TUMOR REMOVAL      OB History    Gravida  3   Para      Term      Preterm      AB      Living  3     SAB      IAB      Ectopic      Multiple      Live Births  3            Home Medications    Prior to Admission  medications   Medication Sig Start Date End Date Taking? Authorizing Provider  albuterol (VENTOLIN HFA) 108 (90 Base) MCG/ACT inhaler Inhale 2 puffs into the lungs every 6 (six) hours as needed for wheezing or shortness of breath.   Yes [provider]  budesonide-formoterol (SYMBICORT) 160-4.5 MCG/ACT inhaler Inhale 2 puffs into the lungs 2 (two) times daily. 10/07/17  Yes Shambley, Melody N, CNM  buPROPion HCl ER, XL, 450 MG TB24 Take 150 mg by mouth in the morning, at noon, and at bedtime. 08/02/19  Yes Hildred Laser, MD  cetirizine (ZYRTEC) 10 MG tablet Take by mouth.   Yes [provider]  estradiol (ESTRACE) 2 MG tablet TAKE 1 TABLET BY MOUTH ONCE DAILY 01/29/20  Yes Hildred Laser, MD  progesterone (PROMETRIUM) 200 MG capsule TAKE 1 CAPSULE BY MOUTH ONCE DAILY 09/14/19  Yes Doreene Burke, CNM  sulfamethoxazole-trimethoprim (BACTRIM DS) 800-160 MG tablet Take 1 tablet by mouth 2 (two) times daily for 7 days. 06/12/20 06/19/20 Yes Becky Augusta, NP  topiramate (TOPAMAX) 100 MG tablet TAKE  1 and 1/2 TABLETS BY MOUTH ONCE DAILY 03/28/20  Yes Rubie Maid, MD  potassium chloride (K-DUR) 10 MEQ tablet Take 1 tablet (10 mEq total) by mouth daily. 08/12/18 06/12/20  Shambley, Melody N, CNM  progesterone (PROMETRIUM) 200 MG capsule TAKE 1 CAPSULE BY MOUTH ONCE DAILY 02/28/20 06/12/20  Philip Aspen, CNM  promethazine (PHENERGAN) 25 MG tablet Take 1 tablet (25 mg total) by mouth every 6 (six) hours as needed for nausea or vomiting. 05/09/18 06/12/20  Norval Gable, MD    Family History Family History  Adopted: Yes  Problem Relation Age of Onset  . Heart disease Maternal Grandfather   . Renal Disease Brother   . Wilson's disease Son     Social History Social History   Tobacco Use  . Smoking status: Former Research scientist (life sciences)  . Smokeless tobacco: Never Used  Vaping Use  . Vaping Use: Never used  Substance Use Topics  . Alcohol use: Yes    Comment: occass  . Drug use: No     Allergies    Amoxicillin, Tape, and Vicodin [hydrocodone-acetaminophen]   Review of Systems Review of Systems  Constitutional: Negative for activity change, appetite change and fever.  Respiratory: Positive for shortness of breath.   Cardiovascular: Positive for chest pain.  Gastrointestinal: Negative for diarrhea, nausea and vomiting.  Genitourinary: Positive for frequency and urgency. Negative for dysuria and hematuria.  Musculoskeletal: Negative for arthralgias and myalgias.  Skin: Negative for color change and rash.  Hematological: Negative.   Psychiatric/Behavioral: Negative.      Physical Exam Triage Vital Signs ED Triage Vitals  Enc Vitals Group     BP 06/12/20 1638 118/81     Pulse Rate 06/12/20 1638 77     Resp 06/12/20 1638 18     Temp 06/12/20 1638 98.1 F (36.7 C)     Temp Source 06/12/20 1638 Oral     SpO2 06/12/20 1638 100 %     Weight 06/12/20 1634 102 lb (46.3 kg)     Height 06/12/20 1634 5\' 4"  (1.626 m)     Head Circumference --      Peak Flow --      Pain Score 06/12/20 1634 8     Pain Loc --      Pain Edu? --      Excl. in [place]? --    No data found.  Updated Vital Signs BP 118/81 (BP Location: Left Arm)   Pulse 77   Temp 98.1 F (36.7 C) (Oral)   Resp 18   Ht 5\' 4"  (1.626 m)   Wt 102 lb (46.3 kg)   SpO2 100%   BMI 17.51 kg/m   Visual Acuity Right Eye Distance:   Left Eye Distance:   Bilateral Distance:    Right Eye Near:   Left Eye Near:    Bilateral Near:     Physical Exam Vitals and nursing note reviewed.  Constitutional:      General: She is not in acute distress.    Appearance: Normal appearance. She is normal weight. She is not toxic-appearing.  HENT:     Head: Normocephalic and atraumatic.  Cardiovascular:     Rate and Rhythm: Normal rate and regular rhythm.     Pulses: Normal pulses.     Heart sounds: Normal heart sounds. No murmur heard. No gallop.   Pulmonary:     Effort: Pulmonary effort is normal. No respiratory distress.      Breath sounds: No wheezing, rhonchi or rales.  Chest:     Chest wall: Tenderness present.  Abdominal:     Tenderness: There is no right CVA tenderness or left CVA tenderness.  Musculoskeletal:        General: Tenderness present.  Skin:    General: Skin is warm and dry.     Capillary Refill: Capillary refill takes less than 2 seconds.     Findings: No bruising, erythema or rash.  Neurological:     General: No focal deficit present.     Mental Status: She is alert and oriented to person, place, and time.  Psychiatric:        Mood and Affect: Mood normal.        Behavior: Behavior normal.        Thought Content: Thought content normal.        Judgment: Judgment normal.      UC Treatments / Results  Labs (all labs ordered are listed, but only abnormal results are displayed) Labs Reviewed  URINALYSIS, COMPLETE (UACMP) WITH MICROSCOPIC - Abnormal; Notable for the following components:      Result Value   APPearance CLOUDY (*)    Nitrite POSITIVE (*)    Bacteria, UA MANY (*)    All other components within normal limits  URINE CULTURE    EKG   Radiology DG Chest 1 View  Result Date: 06/12/2020 CLINICAL DATA:  Recent fall.  Left rib pain EXAM: CHEST  1 VIEW COMPARISON:  PA chest 06/12/2020 FINDINGS: Single lateral view was obtained. The lungs are clear on the lateral view. No pleural effusion. No fracture of the thoracic spine identified. No fracture of the sternum identified. IMPRESSION: Negative lateral chest. Electronically Signed   By: Franchot Gallo M.D.   On: 06/12/2020 17:45   DG Ribs Unilateral W/Chest Left  Result Date: 06/12/2020 CLINICAL DATA:  Left rib pain status post fall. EXAM: LEFT RIBS AND CHEST - 3+ VIEW COMPARISON:  01/25/2019 FINDINGS: No fracture or other bone lesions are seen involving the ribs. There is no evidence of pneumothorax or pleural effusion. Both lungs are clear. Heart size and mediastinal contours are within normal limits. IMPRESSION: Negative.  Electronically Signed   By: Constance Holster M.D.   On: 06/12/2020 17:43    Procedures Procedures (including critical care time)  Medications Ordered in UC Medications - No data to display  Initial Impression / Assessment and Plan / UC Course  I have reviewed the triage vital signs and the nursing notes.  Pertinent labs & imaging results that were available during my care of the patient were reviewed by me and considered in my medical decision making (see chart for details).   Patient is here for evaluation of couple different complaints.  Her first complaint is urinary urgency and frequency that been gone for the past month.  Patient denies any nausea, vomiting, diarrhea, or abdominal pain.  Patient is no CVA tenderness on exam.  Patient reports that she had been on prophylactic Macrobid for week current UTI status post total hysterectomy.  During the procedure she had damage to her bladder and had to have multiple reconstructive surgeries.  She is also had multiple ureteral stents for kidney stones and has had a bladder stone removal.  Patient's UTIs have been well controlled on the prophylactic Macrobid but when her GYN retired her new PCP took her off the [employer].  Patient reports that she is also had a 16 pound weight loss in the last 2 months.  That is not a  new issue.  Additionally patient is complaining of pain on the left side of her chest at the lower aspect of her sternum after slipping and falling on the concrete straps on her chest.  No crepitus on palpation but patient does have tenderness over the distal third of her sternum.  Patient also has pain to palpation over her second third and fourth ribs on the left side.  No crepitus appreciated on exam.  Will send urine for analysis, obtain chest x-ray and left rib film.  UA is nitrite positive but negative for leukocytes.  6-10 squamous epithelials 0-5 WBCs, many bacteria.  Will send urine for culture and treat for UTI with Bactrim  twice daily x7 days.  Radiology read of chest x-ray and rib films are negative for fracture.  We will discharge patient home with a diagnosis of chest wall contusion and have her use over-the-counter NSAIDs and moist heat.  Final Clinical Impressions(s) / UC Diagnoses   Final diagnoses:  Lower urinary tract infectious disease  Chest wall contusion, left, initial encounter     Discharge Instructions     Take the Bactrim twice daily for 7 days.  Increase your oral water intake so you increase your urine production to flush your urinary system.  You can use over-the-counter Tylenol and ibuprofen as needed for your chest wall pain.  You can also apply moist heat for 20 minutes at a time 2-3 times a day.  If your UTI symptoms persist follow-up with your primary care provider.    ED Prescriptions    Medication Sig Dispense Auth. Provider   sulfamethoxazole-trimethoprim (BACTRIM DS) 800-160 MG tablet Take 1 tablet by mouth 2 (two) times daily for 7 days. 14 tablet Margarette Canada, NP     PDMP not reviewed this encounter.   Margarette Canada, NP 06/12/20 1801

## 2020-06-15 LAB — URINE CULTURE
Culture: 80000 — AB
Special Requests: NORMAL

## 2020-07-15 ENCOUNTER — Other Ambulatory Visit: Payer: Self-pay | Admitting: Obstetrics and Gynecology

## 2020-07-15 NOTE — Telephone Encounter (Signed)
Patient needs appointment prior to next refill.

## 2020-07-15 NOTE — Telephone Encounter (Signed)
New message     1. Which medications need to be refilled? (please list name of each medication and dose if known) estradiol (ESTRACE) 2 MG tablet  2. Which pharmacy/location (including street and city if local pharmacy) is medication to be sent to? [facility] [place]

## 2020-07-18 NOTE — Telephone Encounter (Signed)
Pt called no answer and vm full. Was calling pt to schedule an appointment for an annual exam and medication refill and inform her that [facility] had refilled her medication estradiol but would not refill her medication again until she has an appointment.

## 2020-07-23 ENCOUNTER — Telehealth: Payer: Medicaid Other | Admitting: Physician Assistant

## 2020-07-23 DIAGNOSIS — N760 Acute vaginitis: Secondary | ICD-10-CM

## 2020-07-23 MED ORDER — METRONIDAZOLE 500 MG PO TABS: 500.0000 mg | ORAL_TABLET | Freq: Two times a day (BID) | ORAL | 0 refills | Status: DC

## 2020-07-23 NOTE — Progress Notes (Signed)
We are sorry that you are not feeling well. Here is how we plan to help! Based on what you shared with me it looks like you: May have a vaginosis due to bacteria.  The vaginal discharge, fishy odor and change in vaginal pH all indicate bacterial vaginosis as opposed to urinary tract infection.  The best treatment for this is Flagyl (metronidazole).  Bactrim is used to treat urinary tract infections, which is less likely given your symptoms.  If you develop symptoms of a UTI we will be happy to treat that as well.  Vaginosis is an inflammation of the vagina that can result in discharge, itching and pain. The cause is usually a change in the normal balance of vaginal bacteria or an infection. Vaginosis can also result from reduced estrogen levels after menopause.  The most common causes of vaginosis are:   Bacterial vaginosis which results from an overgrowth of one on several organisms that are normally present in your vagina.   Yeast infections which are caused by a naturally occurring fungus called candida.   Vaginal atrophy (atrophic vaginosis) which results from the thinning of the vagina from reduced estrogen levels after menopause.   Trichomoniasis which is caused by a parasite and is commonly transmitted by sexual intercourse.  Factors that increase your risk of developing vaginosis include: Marland Kitchen Medications, such as antibiotics and steroids . Uncontrolled diabetes . Use of hygiene products such as bubble bath, vaginal spray or vaginal deodorant . Douching . Wearing damp or tight-fitting clothing . Using an intrauterine device (IUD) for birth control . Hormonal changes, such as those associated with pregnancy, birth control pills or menopause . Sexual activity . Having a sexually transmitted infection  Your treatment plan is Metronidazole or Flagyl 500mg  twice a day for 7 days.  I have electronically sent this prescription into the pharmacy that you have chosen.  Be sure to take all  of the medication as directed. Stop taking any medication if you develop a rash, tongue swelling or shortness of breath. Mothers who are breast feeding should consider pumping and discarding their breast milk while on these antibiotics. However, there is no consensus that infant exposure at these doses would be harmful.  Remember that medication creams can weaken latex condoms. Marland Kitchen   HOME CARE:  Good hygiene may prevent some types of vaginosis from recurring and may relieve some symptoms:  . Avoid baths, hot tubs and whirlpool spas. Rinse soap from your outer genital area after a shower, and dry the area well to prevent irritation. Don't use scented or harsh soaps, such as those with deodorant or antibacterial action. Marland Kitchen Avoid irritants. These include scented tampons and pads. . Wipe from front to back after using the toilet. Doing so avoids spreading fecal bacteria to your vagina.  Other things that may help prevent vaginosis include:  Marland Kitchen Don't douche. Your vagina doesn't require cleansing other than normal bathing. Repetitive douching disrupts the normal organisms that reside in the vagina and can actually increase your risk of vaginal infection. Douching won't clear up a vaginal infection. . Use a latex condom. Both female and female latex condoms may help you avoid infections spread by sexual contact. . Wear cotton underwear. Also wear pantyhose with a cotton crotch. If you feel comfortable without it, skip wearing underwear to bed. Yeast thrives in [employer] Your symptoms should improve in the next day or two.  GET HELP RIGHT AWAY IF:  . You have pain in your lower abdomen (  pelvic area or over your ovaries) . You develop nausea or vomiting . You develop a fever . Your discharge changes or worsens . You have persistent pain with intercourse . You develop shortness of breath, a rapid pulse, or you faint.  These symptoms could be signs of problems or infections that need to be  evaluated by a medical provider now.  MAKE SURE YOU    Understand these instructions.  Will watch your condition.  Will get help right away if you are not doing well or get worse.  Your e-visit answers were reviewed by a board certified advanced clinical practitioner to complete your personal care plan. Depending upon the condition, your plan could have included both over the counter or prescription medications. Please review your pharmacy choice to make sure that you have choses a pharmacy that is open for you to pick up any needed prescription, Your safety is important to [place]. If you have drug allergies check your prescription carefully.   You can use MyChart to ask questions about today's visit, request a non-urgent call back, or ask for a work or school excuse for 24 hours related to this e-Visit. If it has been greater than 24 hours you will need to follow up with your provider, or enter a new e-Visit to address those concerns. You will get a MyChart message within the next two days asking about your experience. I hope that your e-visit has been valuable and will speed your recovery.   Greater than 5 minutes, yet less than 10 minutes of time have been spent researching, coordinating, and implementing care for this patient today

## 2020-07-26 MED ORDER — ESTRADIOL 2 MG PO TABS: 2.0000 mg | ORAL_TABLET | Freq: Every day | ORAL | 0 refills | Status: DC

## 2020-07-26 NOTE — Addendum Note (Signed)
Addended by: Elouise Munroe on: 07/26/2020 05:05 PM   Modules accepted: Orders

## 2020-07-26 NOTE — Telephone Encounter (Signed)
Will you please contact the pt to schedule an appt for annual exam/medication refill in 1 month. Thanks [employer]

## 2020-08-05 ENCOUNTER — Other Ambulatory Visit: Payer: Self-pay

## 2020-08-06 ENCOUNTER — Other Ambulatory Visit: Payer: Self-pay

## 2020-08-06 MED ORDER — PROGESTERONE 200 MG PO CAPS: 200.0000 mg | ORAL_CAPSULE | Freq: Every day | ORAL | 3 refills | Status: DC

## 2020-10-12 ENCOUNTER — Other Ambulatory Visit: Payer: Self-pay | Admitting: Obstetrics and Gynecology

## 2020-10-14 NOTE — Telephone Encounter (Signed)
Patient has not been seen in over a year. She needs to be scheduled for next available for annual exam (can see any provider).  I have given her a 30 day supply. If she runs out before she is able to have a physical, then she can be scheduled for a medication refill visit until then.

## 2020-10-30 ENCOUNTER — Telehealth: Payer: Self-pay | Admitting: Obstetrics and Gynecology

## 2020-10-30 ENCOUNTER — Other Ambulatory Visit: Payer: Self-pay

## 2020-10-30 ENCOUNTER — Ambulatory Visit (INDEPENDENT_AMBULATORY_CARE_PROVIDER_SITE_OTHER): Payer: Medicaid Other | Admitting: Obstetrics and Gynecology

## 2020-10-30 ENCOUNTER — Encounter: Payer: Self-pay | Admitting: Obstetrics and Gynecology

## 2020-10-30 VITALS — BP 113/76 | HR 91 | Ht 64.0 in | Wt 100.8 lb

## 2020-10-30 DIAGNOSIS — R5382 Chronic fatigue, unspecified: Secondary | ICD-10-CM

## 2020-10-30 DIAGNOSIS — R636 Underweight: Secondary | ICD-10-CM

## 2020-10-30 DIAGNOSIS — R202 Paresthesia of skin: Secondary | ICD-10-CM

## 2020-10-30 DIAGNOSIS — Z01419 Encounter for gynecological examination (general) (routine) without abnormal findings: Secondary | ICD-10-CM

## 2020-10-30 DIAGNOSIS — Z01411 Encounter for gynecological examination (general) (routine) with abnormal findings: Secondary | ICD-10-CM

## 2020-10-30 DIAGNOSIS — R399 Unspecified symptoms and signs involving the genitourinary system: Secondary | ICD-10-CM | POA: Diagnosis not present

## 2020-10-30 DIAGNOSIS — R634 Abnormal weight loss: Secondary | ICD-10-CM

## 2020-10-30 DIAGNOSIS — R200 Anesthesia of skin: Secondary | ICD-10-CM

## 2020-10-30 DIAGNOSIS — R590 Localized enlarged lymph nodes: Secondary | ICD-10-CM

## 2020-10-30 LAB — POCT URINALYSIS DIPSTICK
Bilirubin, UA: NEGATIVE
Blood, UA: NEGATIVE
Glucose, UA: NEGATIVE
Ketones, UA: NEGATIVE
Leukocytes, UA: NEGATIVE
Protein, UA: NEGATIVE
Spec Grav, UA: 1.02 (ref 1.010–1.025)
Urobilinogen, UA: 0.2 E.U./dL
pH, UA: 6.5 (ref 5.0–8.0)

## 2020-10-30 MED ORDER — ALBUTEROL SULFATE HFA 108 (90 BASE) MCG/ACT IN AERS: 2.0000 | INHALATION_SPRAY | Freq: Four times a day (QID) | RESPIRATORY_TRACT | 4 refills | Status: AC | PRN

## 2020-10-30 MED ORDER — BUDESONIDE-FORMOTEROL FUMARATE 160-4.5 MCG/ACT IN AERO: 2.0000 | INHALATION_SPRAY | Freq: Two times a day (BID) | RESPIRATORY_TRACT | 6 refills | Status: AC

## 2020-10-30 NOTE — Telephone Encounter (Signed)
Pt is aware that her medication has been refilled.

## 2020-10-30 NOTE — Patient Instructions (Signed)
Preventive Care 21-33 Years Old, Female Preventive care refers to lifestyle choices and visits with your health care provider that can promote health and wellness. This includes:  A yearly physical exam. This is also called an annual wellness visit.  Regular dental and eye exams.  Immunizations.  Screening for certain conditions.  Healthy lifestyle choices, such as: ? Eating a healthy diet. ? Getting regular exercise. ? Not using drugs or products that contain nicotine and tobacco. ? Limiting alcohol use. What can I expect for my preventive care visit? Physical exam Your health care provider may check your:  Height and weight. These may be used to calculate your BMI (body mass index). BMI is a measurement that tells if you are at a healthy weight.  Heart rate and blood pressure.  Body temperature.  Skin for abnormal spots. Counseling Your health care provider may ask you questions about your:  Past medical problems.  Family's medical history.  Alcohol, tobacco, and drug use.  Emotional well-being.  Home life and relationship well-being.  Sexual activity.  Diet, exercise, and sleep habits.  Work and work environment.  Access to firearms.  Method of birth control.  Menstrual cycle.  Pregnancy history. What immunizations do I need? Vaccines are usually given at various ages, according to a schedule. Your health care provider will recommend vaccines for you based on your age, medical history, and lifestyle or other factors, such as travel or where you work.   What tests do I need? Blood tests  Lipid and cholesterol levels. These may be checked every 5 years starting at age 20.  Hepatitis C test.  Hepatitis B test. Screening  Diabetes screening. This is done by checking your blood sugar (glucose) after you have not eaten for a while (fasting).  STD (sexually transmitted disease) testing, if you are at risk.  BRCA-related cancer screening. This may be  done if you have a family history of breast, ovarian, tubal, or peritoneal cancers.  Pelvic exam and Pap test. This may be done every 3 years starting at age 21. Starting at age 30, this may be done every 5 years if you have a Pap test in combination with an HPV test. Talk with your health care provider about your test results, treatment options, and if necessary, the need for more tests.   Follow these instructions at home: Eating and drinking  Eat a healthy diet that includes fresh fruits and vegetables, whole grains, lean protein, and low-fat dairy products.  Take vitamin and mineral supplements as recommended by your health care provider.  Do not drink alcohol if: ? Your health care provider tells you not to drink. ? You are pregnant, may be pregnant, or are planning to become pregnant.  If you drink alcohol: ? Limit how much you have to 0-1 drink a day. ? Be aware of how much alcohol is in your drink. In the [place] one drink equals one 12 oz bottle of beer (355 mL), one 5 oz glass of wine (148 mL), or one 1 oz glass of hard liquor (44 mL).   Lifestyle  Take daily care of your teeth and gums. Brush your teeth every morning and night with fluoride toothpaste. Floss one time each day.  Stay active. Exercise for at least 30 minutes 5 or more days each week.  Do not use any products that contain nicotine or tobacco, such as cigarettes, e-cigarettes, and chewing tobacco. If you need help quitting, ask your health care provider.  Do not   use drugs.  If you are sexually active, practice safe sex. Use a condom or other form of protection to prevent STIs (sexually transmitted infections).  If you do not wish to become pregnant, use a form of birth control. If you plan to become pregnant, see your health care provider for a prepregnancy visit.  Find healthy ways to cope with stress, such as: ? Meditation, yoga, or listening to music. ? Journaling. ? Talking to a trusted  person. ? Spending time with friends and family. Safety  Always wear your seat belt while driving or riding in a vehicle.  Do not drive: ? If you have been drinking alcohol. Do not ride with someone who has been drinking. ? When you are tired or distracted. ? While texting.  Wear a helmet and other protective equipment during sports activities.  If you have firearms in your house, make sure you follow all gun safety procedures.  Seek help if you have been physically or sexually abused. What's next?  Go to your health care provider once a year for an annual wellness visit.  Ask your health care provider how often you should have your eyes and teeth checked.  Stay up to date on all vaccines. This information is not intended to replace advice given to you by your health care provider. Make sure you discuss any questions you have with your health care provider. Document Revised: 01/21/2020 Document Reviewed: 02/03/2018 Elsevier Patient Education  2021 Elsevier Inc.     Breast Self-Awareness Breast self-awareness means being familiar with how your breasts look and feel. It involves checking your breasts regularly and reporting any changes to your health care provider. Practicing breast self-awareness is important. Sometimes changes may not be harmful (are benign), but sometimes a change in your breasts can be a sign of a serious medical problem. It is important to learn how to do this procedure correctly so that you can catch problems early, when treatment is more likely to be successful. All women should practice breast self-awareness, including women who have had breast implants. What you need:  A mirror.  A well-lit room. How to do a breast self-exam A breast self-exam is one way to learn what is normal for your breasts and whether your breasts are changing. To do a breast self-exam: Look for changes 1. Remove all the clothing above your waist. 2. Stand in front of a mirror  in a room with good lighting. 3. Put your hands on your hips. 4. Push your hands firmly downward. 5. Compare your breasts in the mirror. Look for differences between them (asymmetry), such as: ? Differences in shape. ? Differences in size. ? Puckers, dips, and bumps in one breast and not the other. 6. Look at each breast for changes in the skin, such as: ? Redness. ? Scaly areas. 7. Look for changes in your nipples, such as: ? Discharge. ? Bleeding. ? Dimpling. ? Redness. ? A change in position.   Feel for changes Carefully feel your breasts for lumps and changes. It is best to do this while lying on your back on the floor, and again while sitting or standing in the tub or shower with soapy water on your skin. Feel each breast in the following way: 1. Place the arm on the side of the breast you are examining above your head. 2. Feel your breast with the other hand. 3. Start in the nipple area and make -inch (2 cm) overlapping circles to feel your breast. Use   the pads of your three middle fingers to do this. Apply light pressure, then medium pressure, then firm pressure. The light pressure will allow you to feel the tissue closest to the skin. The medium pressure will allow you to feel the tissue that is a little deeper. The firm pressure will allow you to feel the tissue close to the ribs. 4. Continue the overlapping circles, moving downward over the breast until you feel your ribs below your breast. 5. Move one finger-width toward the center of the body. Continue to use the -inch (2 cm) overlapping circles to feel your breast as you move slowly up toward your collarbone. 6. Continue the up-and-down exam using all three pressures until you reach your armpit.   Write down what you find Writing down what you find can help you remember what to discuss with your health care provider. Write down:  What is normal for each breast.  Any changes that you find in each breast, including: ? The  kind of changes you find. ? Any pain or tenderness. ? Size and location of any lumps.  Where you are in your menstrual cycle, if you are still menstruating. General tips and recommendations  Examine your breasts every month.  If you are breastfeeding, the best time to examine your breasts is after a feeding or after using a breast pump.  If you menstruate, the best time to examine your breasts is 5-7 days after your period. Breasts are generally lumpier during menstrual periods, and it may be more difficult to notice changes.  With time and practice, you will become more familiar with the variations in your breasts and more comfortable with the exam. Contact a health care provider if you:  See a change in the shape or size of your breasts or nipples.  See a change in the skin of your breast or nipples, such as a reddened or scaly area.  Have unusual discharge from your nipples.  Find a lump or thick area that was not there before.  Have pain in your breasts.  Have any concerns related to your breast health. Summary  Breast self-awareness includes looking for physical changes in your breasts, as well as feeling for any changes within your breasts.  Breast self-awareness should be performed in front of a mirror in a well-lit room.  You should examine your breasts every month. If you menstruate, the best time to examine your breasts is 5-7 days after your menstrual period.  Let your health care provider know of any changes you notice in your breasts, including changes in size, changes on the skin, pain or tenderness, or unusual fluid from your nipples. This information is not intended to replace advice given to you by your health care provider. Make sure you discuss any questions you have with your health care provider. Document Revised: 01/11/2018 Document Reviewed: 01/11/2018 Elsevier Patient Education  2021 Elsevier Inc.  

## 2020-10-30 NOTE — Telephone Encounter (Signed)
Maria Silva called in and states she needs refills of Symbicort and Ventolin.  [facility] [place] [facility] [place].

## 2020-10-30 NOTE — Progress Notes (Signed)
Pt present for annual exam. Pt stated that she was having UTI symptoms and been given several rounds of antibiotic and still having frequent urination and urine odor. UA completed.

## 2020-10-30 NOTE — Progress Notes (Signed)
GYNECOLOGY ANNUAL PHYSICAL EXAM PROGRESS NOTE  Subjective:    Maria Silva is a 35 y.o. G3P0 female who presents for an annual exam.  The patient is sexually active. The patient wears seatbelts: yes.   The patient has the following complaints today:  1. Pain on her right side (more aggravating sometimes sharp/stabbing). Feels small nodules in groin.  2. Still having issues with urine, odor.  Reports that she had a UTI over a month ago.  Has been on several rounds of antibiotics (initially treated by telehealth provider, but after symptoms remained, she saw her PCP who changed her antibiotic, but then called her back and placed her back on the same antibiotic she was on before). Has been on ~ 4 rounds of antibiotics so far.  3. Has issues with abnormal weight loss. Initially was trying to lose weight, lost ~ 60-70 lbs (from 200 down to 130s). Unintentional weight loss started 5 months ago.  Noted significant decrease in energy levels, fatigue.  Notes feeling bony structures and it is painful. Low back pain and left rib pain. Is taking vitamins. Noting night sweats. Just started trying to do Boost or Ensure.  4. Reports numbness/tingling in hands and feet, very painful. Also notes numbness and tingling on right side of her face, and pressure on left side.  5. Reports pain at her tailbone. Can feel her bones sticking out. Is difficult to sit for long periods of time. Feels a knot there.   Menstrual History: No LMP recorded. Patient has had a hysterectomy.    Gynecologic History: Contraception: status post hysterectomy History of STI's:  Last Pap: 12/08/2018. Results were: normal.  Denies h/o abnormal pap smears.    OB History  Gravida Para Term Preterm AB Living  3 0 0 0 0 3  SAB IAB Ectopic Multiple Live Births  0 0 0 0 3    # Outcome Date GA Lbr Len/2nd Weight Sex Delivery Anes PTL Lv  3 Gravida 2010    M CS-Unspec   LIV  2 Gravida 2009    M CS-Unspec   LIV  1 Gravida 2008     F Vag-Spont   LIV    Past Medical History:  Diagnosis Date  . Asthma   . Benign tumor of cervix   . COPD (chronic obstructive pulmonary disease) ([place])   . COVID-19 06/2019  . Kidney stone   . Migraine   . Ovarian tumor   . Sternal fracture   . SVT (supraventricular tachycardia) ([facility])     Past Surgical History:  Procedure Laterality Date  . ABDOMINAL HYSTERECTOMY    . APPENDECTOMY    . BLADDER REPAIR    . BLADDER REPAIR W/ CESAREAN SECTION    . bladder stent    . Laparascopic    . MINOR HEMORRHOIDECTOMY    . TUMOR REMOVAL      Family History  Adopted: Yes  Problem Relation Age of Onset  . Heart disease Maternal Grandfather   . Renal Disease Brother   . Wilson's disease Son     Social History   Socioeconomic History  . Marital status: Legally Separated    Spouse name: Not on file  . Number of children: Not on file  . Years of education: Not on file  . Highest education level: Not on file  Occupational History  . Not on file  Tobacco Use  . Smoking status: Former Research scientist (life sciences)  . Smokeless tobacco: Never Used  Vaping Use  .  Vaping Use: Never used  Substance and Sexual Activity  . Alcohol use: Yes    Comment: occass  . Drug use: No  . Sexual activity: Yes    Birth control/protection: Surgical, Condom  Other Topics Concern  . Not on file  Social History Narrative  . Not on file   Social Determinants of Health   Financial Resource Strain: Not on file  Food Insecurity: Not on file  Transportation Needs: Not on file  Physical Activity: Not on file  Stress: Not on file  Social Connections: Not on file  Intimate Partner Violence: Not on file    Current Outpatient Medications on File Prior to Visit  Medication Sig Dispense Refill  . albuterol (VENTOLIN HFA) 108 (90 Base) MCG/ACT inhaler Inhale 2 puffs into the lungs every 6 (six) hours as needed for wheezing or shortness of breath.    . budesonide-formoterol (SYMBICORT) 160-4.5 MCG/ACT inhaler Inhale 2  puffs into the lungs 2 (two) times daily. 1 Inhaler 6  . buPROPion HCl ER, XL, 450 MG TB24 Take 150 mg by mouth in the morning, at noon, and at bedtime. 90 tablet 11  . cetirizine (ZYRTEC) 10 MG tablet Take by mouth.    . estradiol (ESTRACE) 2 MG tablet Take 1 tablet (2 mg total) by mouth daily. 90 tablet 0  . topiramate (TOPAMAX) 100 MG tablet TAKE 1 and 1/2 TABLETS BY MOUTH ONCE DAILY 135 tablet 2  . progesterone (PROMETRIUM) 200 MG capsule Take 1 capsule (200 mg total) by mouth daily. 30 capsule 3  . [DISCONTINUED] potassium chloride (K-DUR) 10 MEQ tablet Take 1 tablet (10 mEq total) by mouth daily. 30 tablet 1  . [DISCONTINUED] promethazine (PHENERGAN) 25 MG tablet Take 1 tablet (25 mg total) by mouth every 6 (six) hours as needed for nausea or vomiting. 10 tablet 0   No current facility-administered medications on file prior to visit.    Allergies  Allergen Reactions  . Amoxicillin Nausea And Vomiting and Rash  . Tape Rash  . Vicodin [Hydrocodone-Acetaminophen] Nausea And Vomiting and Rash      Review of Systems Constitutional: negative for chills, fevers and sweats.  Positive for weight loss and fatigue Eyes: negative for irritation, redness and visual disturbance Ears, nose, mouth, throat, and face: negative for hearing loss, nasal congestion, snoring and tinnitus Respiratory: negative for asthma, cough, sputum Cardiovascular: negative for chest pain, dyspnea, exertional chest pressure/discomfort, irregular heart beat, palpitations and syncope Gastrointestinal: negative for abdominal pain, change in bowel habits, nausea and vomiting Genitourinary: negative for abnormal menstrual periods, genital lesions, sexual problems and vaginal discharge, dysuria and urinary incontinence Integument/breast: negative for breast lump, breast tenderness and nipple discharge Hematologic/lymphatic: negative for bleeding and easy bruising.  Musculoskeletal:negative for back pain and muscle  weakness. Positive for rib pain and low back pain (tailbone) Neurological: negative for dizziness, headaches, vertigo and weakness Endocrine: negative for diabetic symptoms including polydipsia, polyuria and skin dryness Allergic/Immunologic: negative for hay fever and urticaria        Objective:  Blood pressure 113/76, pulse 91, height 5\' 4"  (1.626 m), weight 100 lb 12.8 oz (45.7 kg). Body mass index is 17.3 kg/m.  General Appearance:    Alert, cooperative, no distress, appears stated age  Head:    Normocephalic, without obvious abnormality, atraumatic  Eyes:    PERRL, conjunctiva/corneas clear, EOM's intact, both eyes  Ears:    Normal external ear canals, both ears  Nose:   Nares normal, septum midline, mucosa normal, no drainage  or sinus tenderness  Throat:   Lips, mucosa, and tongue normal; teeth and gums normal  Neck:   Supple, symmetrical, trachea midline, no adenopathy; thyroid: no enlargement/tenderness/nodules; no carotid bruit or JVD  Back:     Symmetric, no curvature, ROM normal, no CVA tenderness  Lungs:     Clear to auscultation bilaterally, respirations unlabored  Chest Wall:    No tenderness or deformity   Heart:    Regular rate and rhythm, S1 and S2 normal, no murmur, rub or gallop  Breast Exam:    No tenderness, masses, or nipple abnormality  Abdomen:     Soft, non-tender, bowel sounds active all four quadrants, no masses, no organomegaly.    Genitalia:    Pelvic:external genitalia normal, vagina without lesions, discharge, or tenderness, rectovaginal septum  normal. Cervix normal in appearance, no cervical motion tenderness, no adnexal masses or tenderness.  Uterus normal size, shape, mobile, regular contours, nontender.  Rectal:    Normal external sphincter.  No hemorrhoids appreciated. Internal exam not done.   Extremities:   Extremities normal, atraumatic, no cyanosis or edema  Pulses:   2+ and symmetric all extremities  Skin:   Skin color, texture, turgor normal, no  rashes or lesions  Lymph nodes:   Cervical, supraclavicular, and axillary nodes normal  Neurologic:   CNII-XII intact, normal strength, sensation and reflexes throughout     Labs:  Lab Results  Component Value Date   WBC 3.5 12/08/2018   HGB 14.2 12/08/2018   HCT 41.5 12/08/2018   MCV 95 12/08/2018   PLT 272 12/08/2018    Lab Results  Component Value Date   CREATININE 0.76 12/08/2018   BUN 11 12/08/2018   NA 139 12/08/2018   K 4.2 12/08/2018   CL 108 (H) 12/08/2018   CO2 20 12/08/2018    Lab Results  Component Value Date   ALT 14 12/08/2018   AST 11 12/08/2018   ALKPHOS 44 12/08/2018   BILITOT 0.3 12/08/2018    Lab Results  Component Value Date   TSH 1.900 12/08/2018    Results for orders placed or performed in visit on 10/30/20  POCT urinalysis dipstick  Result Value Ref Range   Color, UA yellow    Clarity, UA clear    Glucose, UA Negative Negative   Bilirubin, UA neg    Ketones, UA neg    Spec Grav, UA 1.020 1.010 - 1.025   Blood, UA neg    pH, UA 6.5 5.0 - 8.0   Protein, UA Negative Negative   Urobilinogen, UA 0.2 0.2 or 1.0 E.U./dL   Nitrite, UA large    Leukocytes, UA Negative Negative   Appearance yellow;clear    Odor yes     Assessment:   1. Weight loss, abnormal   2. Encounter for gynecological examination (general) (routine) with abnormal findings   3. UTI symptoms   4. Chronic fatigue   5. Numbness and tingling   6. Lymphadenopathy, inguinal   7. Underweight      Plan:    - Blood tests: see orders. - Breast self exam technique reviewed and patient encouraged to perform self-exam monthly. - Contraception: status post hysterectomy. - Pap smear: no longer needed.  - Unclear cause of fatigue, further unintentional weight loss, numbness and tingling, and lymphadenopathy. Fatigue panel, vitamin levels, and ANA ordered. Also will perform full panel STI screening.  Patient reports that she has started drinking Boost to help combat weight  loss.  - UTI symptoms,  patient with positive nitrates on UA. Will get culture prior to treating. - Will notify patient of all results in [place].  - Return to clinic for any scheduled appointments or for any gynecologic concerns as needed. Follow up in 1 year for annual exam.    Rubie Maid, MD [facility]

## 2020-10-31 LAB — COMPREHENSIVE METABOLIC PANEL
ALT: 16 IU/L (ref 0–32)
AST: 11 IU/L (ref 0–40)
Albumin/Globulin Ratio: 1.9 (ref 1.2–2.2)
Albumin: 4.8 g/dL (ref 3.8–4.8)
Alkaline Phosphatase: 44 IU/L (ref 44–121)
BUN/Creatinine Ratio: 13 (ref 9–23)
BUN: 11 mg/dL (ref 6–20)
Bilirubin Total: 0.4 mg/dL (ref 0.0–1.2)
CO2: 19 mmol/L — ABNORMAL LOW (ref 20–29)
Calcium: 9.7 mg/dL (ref 8.7–10.2)
Chloride: 109 mmol/L — ABNORMAL HIGH (ref 96–106)
Creatinine, Ser: 0.87 mg/dL (ref 0.57–1.00)
Globulin, Total: 2.5 g/dL (ref 1.5–4.5)
Glucose: 50 mg/dL — ABNORMAL LOW (ref 65–99)
Potassium: 4.3 mmol/L (ref 3.5–5.2)
Sodium: 142 mmol/L (ref 134–144)
Total Protein: 7.3 g/dL (ref 6.0–8.5)
eGFR: 90 mL/min/{1.73_m2} (ref >59)

## 2020-10-31 LAB — CBC
Hematocrit: 43.1 % (ref 34.0–46.6)
Hemoglobin: 14.7 g/dL (ref 11.1–15.9)
MCH: 33 pg (ref 26.6–33.0)
MCHC: 34.1 g/dL (ref 31.5–35.7)
MCV: 97 fL (ref 79–97)
Platelets: 270 10*3/uL (ref 150–450)
RBC: 4.45 x10E6/uL (ref 3.77–5.28)
RDW: 11.7 % (ref 11.7–15.4)
WBC: 3.3 10*3/uL — ABNORMAL LOW (ref 3.4–10.8)

## 2020-10-31 LAB — ANA: Anti Nuclear Antibody (ANA): NEGATIVE

## 2020-10-31 LAB — HIV ANTIBODY (ROUTINE TESTING W REFLEX): HIV Screen 4th Generation wRfx: NONREACTIVE

## 2020-10-31 LAB — LIPID PANEL
Chol/HDL Ratio: 3.3 ratio (ref 0.0–4.4)
Cholesterol, Total: 209 mg/dL — ABNORMAL HIGH (ref 100–199)
HDL: 64 mg/dL (ref >39)
LDL Chol Calc (NIH): 122 mg/dL — ABNORMAL HIGH (ref 0–99)
Triglycerides: 132 mg/dL (ref 0–149)
VLDL Cholesterol Cal: 23 mg/dL (ref 5–40)

## 2020-10-31 LAB — RPR: RPR Ser Ql: NONREACTIVE

## 2020-10-31 LAB — HEMOGLOBIN A1C
Est. average glucose Bld gHb Est-mCnc: 100 mg/dL
Hgb A1c MFr Bld: 5.1 % (ref 4.8–5.6)

## 2020-10-31 LAB — FOLATE: Folate: 12.3 ng/mL (ref >3.0)

## 2020-10-31 LAB — VITAMIN B12: Vitamin B-12: 262 pg/mL (ref 232–1245)

## 2020-10-31 LAB — TSH: TSH: 1.85 u[IU]/mL (ref 0.450–4.500)

## 2020-10-31 LAB — HEPATITIS C ANTIBODY: Hep C Virus Ab: <0.1 s/co ratio (ref 0.0–0.9)

## 2020-10-31 LAB — VITAMIN D 25 HYDROXY (VIT D DEFICIENCY, FRACTURES): Vit D, 25-Hydroxy: 33.8 ng/mL (ref 30.0–100.0)

## 2020-11-04 LAB — URINE CULTURE

## 2020-11-06 MED ORDER — CEPHALEXIN 500 MG PO CAPS: 500.0000 mg | ORAL_CAPSULE | Freq: Three times a day (TID) | ORAL | 0 refills | Status: DC

## 2020-11-06 MED ORDER — CEPHALEXIN 250 MG PO CAPS: 250.0000 mg | ORAL_CAPSULE | Freq: Every day | ORAL | 1 refills | Status: DC

## 2020-11-12 ENCOUNTER — Other Ambulatory Visit: Payer: Self-pay | Admitting: Obstetrics and Gynecology

## 2020-11-12 ENCOUNTER — Telehealth: Payer: Self-pay | Admitting: Obstetrics and Gynecology

## 2020-11-12 MED ORDER — BUPROPION HCL ER (XL) 450 MG PO TB24: 150.0000 mg | ORAL_TABLET | Freq: Three times a day (TID) | ORAL | 3 refills | Status: DC

## 2020-11-12 NOTE — Progress Notes (Signed)
Patient requests refill of her Wellbutrin.

## 2020-11-12 NOTE — Telephone Encounter (Signed)
Pt called stating that she was suppose to get a refill for Wellbutrin, [facility] [place] Please Advise.

## 2020-11-12 NOTE — Telephone Encounter (Signed)
I have not received a refill request for this from her pharmacy. But I will check her chart and if she is due I can send it in.

## 2020-11-14 NOTE — Telephone Encounter (Signed)
Pt is aware that her medication has been refilled and sent to the pharmacy.

## 2020-11-27 ENCOUNTER — Telehealth: Payer: Medicaid Other | Admitting: Physician Assistant

## 2020-11-27 DIAGNOSIS — R110 Nausea: Secondary | ICD-10-CM

## 2020-11-27 DIAGNOSIS — Z3201 Encounter for pregnancy test, result positive: Secondary | ICD-10-CM

## 2020-11-28 NOTE — Progress Notes (Signed)
Based on what you shared with me, I feel your condition warrants further evaluation and I recommend that you be seen in a face to face visit.   NOTE: There will be NO CHARGE for this eVisit   If you are having a true medical emergency please call 911.      For an urgent face to face visit, [place] has six urgent care centers for your convenience:     [facility] [place] [facility] [place] Get Driving Directions [phone] [place], [place] [address]    [facility]) Get Driving Directions [phone] [place], [place] [address]  [facility] [place] ([place]) Get Driving Directions [phone] [address] [place],  [place]  [address]  [facility] Get Driving Directions [phone] [place], [place], [place] [address]   [facility] Get Driving Directions  [phone] [address] [place], [place] [address]   [facility] [place] Get Driving Directions [phone] [address] [place], [place] [address]  Your MyChart E-visit questionnaire answers were reviewed by a board certified advanced clinical practitioner to complete your personal care plan based on your specific symptoms.  Thank you for using e-Visits.   I provided 5 minutes of non face-to-face time during this encounter for chart review and documentation.

## 2020-12-06 ENCOUNTER — Other Ambulatory Visit: Payer: Self-pay | Admitting: Obstetrics and Gynecology

## 2021-01-23 ENCOUNTER — Other Ambulatory Visit: Payer: Self-pay

## 2021-01-23 MED ORDER — ESTRADIOL 2 MG PO TABS: 2.0000 mg | ORAL_TABLET | Freq: Every day | ORAL | 3 refills | Status: DC

## 2021-03-21 ENCOUNTER — Other Ambulatory Visit: Payer: Self-pay | Admitting: Obstetrics and Gynecology

## 2021-04-19 ENCOUNTER — Other Ambulatory Visit: Payer: Self-pay | Admitting: Obstetrics and Gynecology

## 2021-07-08 IMAGING — CR DG CHEST 1V
2 series · 2 of 2 positions shown · non-contrast
Comparison: PA chest 06/12/2020

CLINICAL DATA: Recent fall.  Left rib pain

EXAM:
CHEST  1 VIEW

[chest lat (1 of 2)]
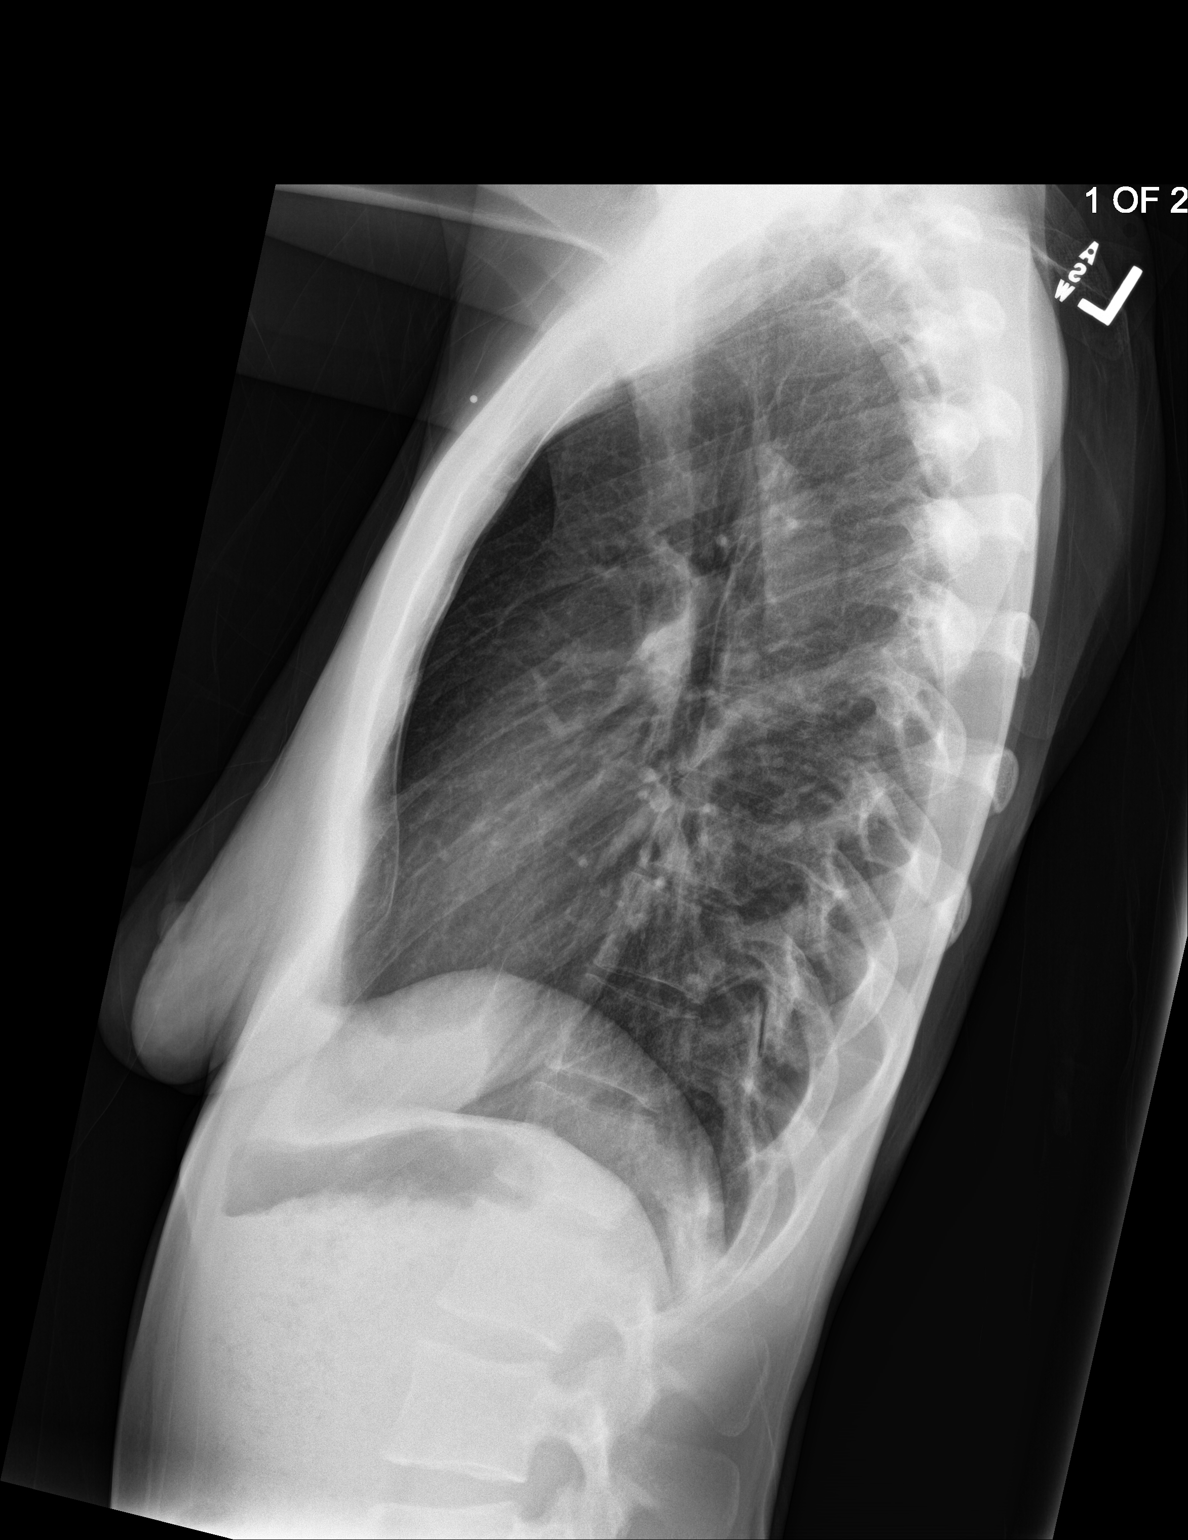

[chest lat (2 of 2)]
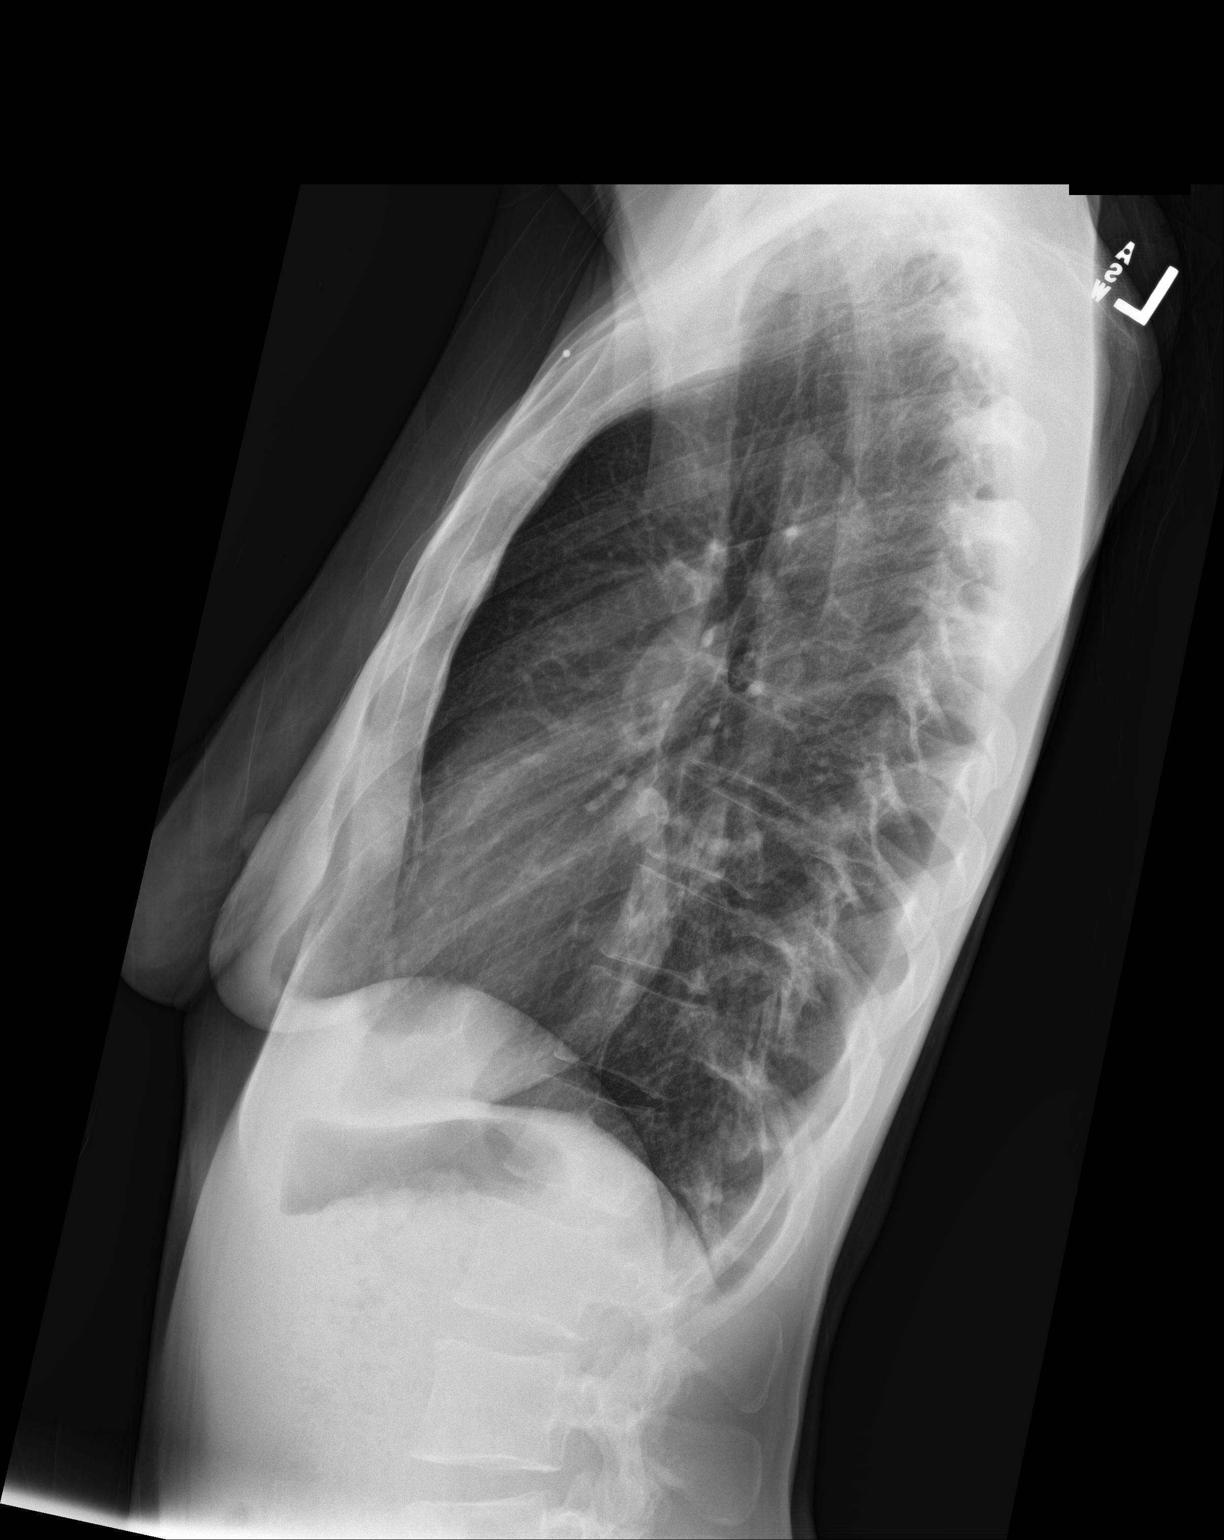

[2 of 2 positions shown; findings below may reference images not displayed]

FINDINGS: Single lateral view was obtained. The lungs are clear on the lateral
view. No pleural effusion. No fracture of the thoracic spine
identified. No fracture of the sternum identified.
IMPRESSION: Negative lateral chest.

## 2021-07-26 ENCOUNTER — Telehealth: Payer: Medicaid Other | Admitting: Nurse Practitioner

## 2021-07-26 DIAGNOSIS — B3731 Acute candidiasis of vulva and vagina: Secondary | ICD-10-CM

## 2021-07-26 MED ORDER — FLUCONAZOLE 150 MG PO TABS: 150.0000 mg | ORAL_TABLET | Freq: Once | ORAL | 1 refills | Status: AC

## 2021-07-26 NOTE — Progress Notes (Signed)
I will treat it as a yeast infection since you have itching, white discharge and no odor.  E-Visit for Vaginal Symptoms  We are sorry that you are not feeling well. Here is how we plan to help! Based on what you shared with me it looks like you: May have a yeast vaginosis  Vaginosis is an inflammation of the vagina that can result in discharge, itching and pain. The cause is usually a change in the normal balance of vaginal bacteria or an infection. Vaginosis can also result from reduced estrogen levels after menopause.  The most common causes of vaginosis are:   Bacterial vaginosis which results from an overgrowth of one on several organisms that are normally present in your vagina.   Yeast infections which are caused by a naturally occurring fungus called candida.   Vaginal atrophy (atrophic vaginosis) which results from the thinning of the vagina from reduced estrogen levels after menopause.   Trichomoniasis which is caused by a parasite and is commonly transmitted by sexual intercourse.  Factors that increase your risk of developing vaginosis include: Medications, such as antibiotics and steroids Uncontrolled diabetes Use of hygiene products such as bubble bath, vaginal spray or vaginal deodorant Douching Wearing damp or tight-fitting clothing Using an intrauterine device (IUD) for birth control Hormonal changes, such as those associated with pregnancy, birth control pills or menopause Sexual activity Having a sexually transmitted infection  Your treatment plan is A single Diflucan (fluconazole) 150mg  tablet once.  I have electronically sent this prescription into the pharmacy that you have chosen.  Be sure to take all of the medication as directed. Stop taking any medication if you develop a rash, tongue swelling or shortness of breath. Mothers who are breast feeding should consider pumping and discarding their breast milk while on these antibiotics. However, there is no  consensus that infant exposure at these doses would be harmful.  Remember that medication creams can weaken latex condoms. [place]   HOME CARE:  Good hygiene may prevent some types of vaginosis from recurring and may relieve some symptoms:  Avoid baths, hot tubs and whirlpool spas. Rinse soap from your outer genital area after a shower, and dry the area well to prevent irritation. Don't use scented or harsh soaps, such as those with deodorant or antibacterial action. Avoid irritants. These include scented tampons and pads. Wipe from front to back after using the toilet. Doing so avoids spreading fecal bacteria to your vagina.  Other things that may help prevent vaginosis include:  Don't douche. Your vagina doesn't require cleansing other than normal bathing. Repetitive douching disrupts the normal organisms that reside in the vagina and can actually increase your risk of vaginal infection. Douching won't clear up a vaginal infection. Use a latex condom. Both female and female latex condoms may help you avoid infections spread by sexual contact. Wear cotton underwear. Also wear pantyhose with a cotton crotch. If you feel comfortable without it, skip wearing underwear to bed. Yeast thrives in [employer] Your symptoms should improve in the next day or two.  GET HELP RIGHT AWAY IF:  You have pain in your lower abdomen ( pelvic area or over your ovaries) You develop nausea or vomiting You develop a fever Your discharge changes or worsens You have persistent pain with intercourse You develop shortness of breath, a rapid pulse, or you faint.  These symptoms could be signs of problems or infections that need to be evaluated by a medical provider now.  MAKE SURE YOU  Understand these instructions. Will watch your condition. Will get help right away if you are not doing well or get worse.  Thank you for choosing an e-visit.  Your e-visit answers were reviewed by a board certified  advanced clinical practitioner to complete your personal care plan. Depending upon the condition, your plan could have included both over the counter or prescription medications.  Please review your pharmacy choice. Make sure the pharmacy is open so you can pick up prescription now. If there is a problem, you may contact your provider through [employer] and have the prescription routed to another pharmacy.  Your safety is important to [place]. If you have drug allergies check your prescription carefully.   For the next 24 hours you can use MyChart to ask questions about today's visit, request a non-urgent call back, or ask for a work or school excuse. You will get an email in the next two days asking about your experience. I hope that your e-visit has been valuable and will speed your recovery.

## 2021-07-26 NOTE — Progress Notes (Signed)
I have spent 5 minutes in review of e-visit questionnaire, review and updating patient chart, medical decision making and response to patient.  ° °Shanan Fitzpatrick W Tyniesha Howald, NP ° °  °

## 2021-08-13 ENCOUNTER — Other Ambulatory Visit: Payer: Self-pay | Admitting: Obstetrics and Gynecology

## 2021-08-15 ENCOUNTER — Telehealth: Payer: Medicaid Other | Admitting: Nurse Practitioner

## 2021-08-15 DIAGNOSIS — B3731 Acute candidiasis of vulva and vagina: Secondary | ICD-10-CM

## 2021-08-16 MED ORDER — FLUCONAZOLE 150 MG PO TABS: 150.0000 mg | ORAL_TABLET | Freq: Once | ORAL | 0 refills | Status: AC

## 2021-08-16 NOTE — Progress Notes (Signed)

## 2021-08-17 ENCOUNTER — Telehealth: Payer: Medicaid Other | Admitting: Family

## 2021-08-17 DIAGNOSIS — N898 Other specified noninflammatory disorders of vagina: Secondary | ICD-10-CM

## 2021-08-17 NOTE — Progress Notes (Signed)
Based on what you shared with me, I feel your condition warrants further evaluation and I recommend that you be seen in a face to face visit. ? ?Given you have recurrent vaginal discharge, you need to be seen in person to rule out other infections.  ?  ?NOTE: There will be NO CHARGE for this eVisit ?  ?If you are having a true medical emergency please call 911.   ?  ? For an urgent face to face visit, [place] has six urgent care centers for your convenience:  ?  ? [place] [facility] [place] [facility] ?Get Driving Directions ?[phone] ?[address] ?[place], [place] [address] ?  ? [facility]) ?Get Driving Directions ?[phone] ?[address] ?[place], [place] [address] ? ?[place] [facility] [place] [facility] ?Get Driving Directions ?[address] [place],  [place]  [address] ? ?[facility] ?Get Driving Directions ?[phone] ?[address] ?[place], [place] [address] ?  ?[facility] [place] ?Get Driving Directions  ?[phone] ?[address] ?[address] ?[place], [place] [address] ?  ?[place] [facility] ?Get Driving Directions ?[phone] ?[address] ?[place],  [address] ? ?Your MyChart E-visit questionnaire answers were reviewed by a board certified advanced clinical practitioner to complete your personal care plan based on your specific symptoms.  Thank you for using e-Visits. ?  ? ?

## 2021-09-09 ENCOUNTER — Telehealth: Payer: Medicaid Other | Admitting: Physician Assistant

## 2021-09-09 DIAGNOSIS — R509 Fever, unspecified: Secondary | ICD-10-CM

## 2021-09-09 DIAGNOSIS — M5442 Lumbago with sciatica, left side: Secondary | ICD-10-CM

## 2021-09-09 DIAGNOSIS — Z87448 Personal history of other diseases of urinary system: Secondary | ICD-10-CM

## 2021-09-09 NOTE — Progress Notes (Signed)
Based on what you shared with me, I feel your condition warrants further evaluation and I recommend that you be seen in a face to face visit. ? ?Giving associated fever and change to urination along with the lower back pain, you need to be evaluated in person ASAP to assess for more significant causes of back pain versus multiple issues present at the same time (sciatica and UTI versus other causes). This is above the scope of e-visit or video visit. If no PCP, please use resources below to be seen at nearest Urgent Care.  ?  ?NOTE: There will be NO CHARGE for this eVisit ?  ?If you are having a true medical emergency please call 911.   ?  ? For an urgent face to face visit, [place] has six urgent care centers for your convenience:  ?  ? [place] [facility] [place] [facility] ?Get Driving Directions ?[phone] ?[address] ?[place], [place] [address] ?  ? [place] [facility]) ?Get Driving Directions ?[phone] ?[address] ?[place], [place] [address] ? ?[place] [facility] [place]) ?Get Driving Directions ?[place],  [place]  [address] ? ?[place] [facility] ?Get Driving Directions ?[phone] ?[address] ?[place], [place] [address] ?  ?[place] [facility] [place] ?Get Driving Directions  ?[phone] ?[address] ?[address] ?[place], [place] [address] ?  ?[place] [facility] ?Get Driving Directions ?[phone] ?[address] ?[place], [place] [address] ? ?Your MyChart E-visit questionnaire answers were reviewed by a board certified advanced clinical practitioner to complete your personal care plan based on your specific symptoms.  Thank you for using e-Visits. ?  ? ?

## 2021-10-05 ENCOUNTER — Other Ambulatory Visit: Payer: Self-pay | Admitting: Obstetrics and Gynecology

## 2021-10-10 ENCOUNTER — Other Ambulatory Visit: Payer: Self-pay | Admitting: Obstetrics and Gynecology

## 2021-10-30 ENCOUNTER — Telehealth: Payer: Medicaid Other | Admitting: Family Medicine

## 2021-10-30 ENCOUNTER — Encounter: Payer: Self-pay | Admitting: Family Medicine

## 2021-10-30 DIAGNOSIS — U071 COVID-19: Secondary | ICD-10-CM

## 2021-10-30 MED ORDER — FLUTICASONE PROPIONATE 50 MCG/ACT NA SUSP: 2.0000 | Freq: Every day | NASAL | 0 refills | Status: DC

## 2021-10-30 MED ORDER — PROMETHAZINE-DM 6.25-15 MG/5ML PO SYRP: 5.0000 mL | ORAL_SOLUTION | Freq: Three times a day (TID) | ORAL | 0 refills | Status: DC | PRN

## 2021-10-30 MED ORDER — BENZONATATE 100 MG PO CAPS: 100.0000 mg | ORAL_CAPSULE | Freq: Two times a day (BID) | ORAL | 0 refills | Status: DC | PRN

## 2021-10-30 NOTE — Patient Instructions (Signed)
Please keep well-hydrated and get plenty of rest.  You can start the below if needed or desired: Start a saline nasal rinse to flush out your nasal passages. You can use plain Mucinex to help thin congestion. If you have a humidifier, running in the bedroom at night. I want you to start OTC vitamin D3 1000 units daily, vitamin C 1000 mg daily, and a zinc supplement. Please take prescribed medications as directed.  You have been enrolled in a MyChart symptom monitoring program. Please answer these questions daily so we can keep track of how you are doing.  You were to quarantine for 5 days from onset of your symptoms.  After day 5, if you have had no fever and you are feeling better, you can end quarantine but need to mask for an additional 5 days. After day 5 if you have a fever or are having significant symptoms, please quarantine for full 10 days.  If you note any worsening of symptoms, any significant shortness of breath or any chest pain, please seek ER evaluation ASAP.  Please do not delay care!  COVID-19: What to Do if You Are Sick If you test positive and are an older adult or someone who is at high risk of getting very sick from COVID-19, treatment may be available. Contact a healthcare provider right away after a positive test to determine if you are eligible, even if your symptoms are mild right now. You can also visit a Test to Treat location and, if eligible, receive a prescription from a provider. Don't delay: Treatment must be started within the first few days to be effective. If you have a fever, cough, or other symptoms, you might have COVID-19. Most people have mild illness and are able to recover at home. If you are sick: Keep track of your symptoms. If you have an emergency warning sign (including trouble breathing), call 911. Steps to help prevent the spread of COVID-19 if you are sick If you are sick with COVID-19 or think you might have COVID-19, follow the steps below to  care for yourself and to help protect other people in your home and community. Stay home except to get medical care Stay home. Most people with COVID-19 have mild illness and can recover at home without medical care. Do not leave your home, except to get medical care. Do not visit public areas and do not go to places where you are unable to wear a mask. Take care of yourself. Get rest and stay hydrated. Take over-the-counter medicines, such as acetaminophen, to help you feel better. Stay in touch with your doctor. Call before you get medical care. Be sure to get care if you have trouble breathing, or have any other emergency warning signs, or if you think it is an emergency. Avoid public transportation, ride-sharing, or taxis if possible. Get tested If you have symptoms of COVID-19, get tested. While waiting for test results, stay away from others, including staying apart from those living in your household. Get tested as soon as possible after your symptoms start. Treatments may be available for people with COVID-19 who are at risk for becoming very sick. Don't delay: Treatment must be started early to be effective--some treatments must begin within 5 days of your first symptoms. Contact your healthcare provider right away if your test result is positive to determine if you are eligible. Self-tests are one of several options for testing for the virus that causes COVID-19 and may be more convenient than  laboratory-based tests and point-of-care tests. Ask your healthcare provider or your local health department if you need help interpreting your test results. You can visit your state, tribal, local, and territorial health department's website to look for the latest local information on testing sites. Separate yourself from other people As much as possible, stay in a specific room and away from other people and pets in your home. If possible, you should use a separate bathroom. If you need to be around  other people or animals in or outside of the home, wear a well-fitting mask. Tell your close contacts that they may have been exposed to COVID-19. An infected person can spread COVID-19 starting 48 hours (or 2 days) before the person has any symptoms or tests positive. By letting your close contacts know they may have been exposed to COVID-19, you are helping to protect everyone. See COVID-19 and Animals if you have questions about pets. If you are diagnosed with COVID-19, someone from the health department may call you. Answer the call to slow the spread. Monitor your symptoms Symptoms of COVID-19 include fever, cough, or other symptoms. Follow care instructions from your healthcare provider and local health department. Your local health authorities may give instructions on checking your symptoms and reporting information. When to seek emergency medical attention Look for emergency warning signs* for COVID-19. If someone is showing any of these signs, seek emergency medical care immediately: Trouble breathing Persistent pain or pressure in the chest New confusion Inability to wake or stay awake Pale, gray, or blue-colored skin, lips, or nail beds, depending on skin tone *This list is not all possible symptoms. Please call your medical provider for any other symptoms that are severe or concerning to you. Call 911 or call ahead to your local emergency facility: Notify the operator that you are seeking care for someone who has or may have COVID-19. Call ahead before visiting your doctor Call ahead. Many medical visits for routine care are being postponed or done by phone or telemedicine. If you have a medical appointment that cannot be postponed, call your doctor's office, and tell them you have or may have COVID-19. This will help the office protect themselves and other patients. If you are sick, wear a well-fitting mask You should wear a mask if you must be around other people or animals,  including pets (even at home). Wear a mask with the best fit, protection, and comfort for you. You don't need to wear the mask if you are alone. If you can't put on a mask (because of trouble breathing, for example), cover your coughs and sneezes in some other way. Try to stay at least 6 feet away from other people. This will help protect the people around you. Masks should not be placed on young children under age 63 years, anyone who has trouble breathing, or anyone who is not able to remove the mask without help. Cover your coughs and sneezes Cover your mouth and nose with a tissue when you cough or sneeze. Throw away used tissues in a lined trash can. Immediately wash your hands with soap and water for at least 20 seconds. If soap and water are not available, clean your hands with an alcohol-based hand sanitizer that contains at least 60% alcohol. Clean your hands often Wash your hands often with soap and water for at least 20 seconds. This is especially important after blowing your nose, coughing, or sneezing; going to the bathroom; and before eating or preparing food. Use hand  sanitizer if soap and water are not available. Use an alcohol-based hand sanitizer with at least 60% alcohol, covering all surfaces of your hands and rubbing them together until they feel dry. Soap and water are the best option, especially if hands are visibly dirty. Avoid touching your eyes, nose, and mouth with unwashed hands. Handwashing Tips Avoid sharing personal household items Do not share dishes, drinking glasses, cups, eating utensils, towels, or bedding with other people in your home. Wash these items thoroughly after using them with soap and water or put in the dishwasher. Clean surfaces in your home regularly Clean and disinfect high-touch surfaces (for example, doorknobs, tables, handles, light switches, and countertops) in your "sick room" and bathroom. In shared spaces, you should clean and disinfect  surfaces and items after each use by the person who is ill. If you are sick and cannot clean, a caregiver or other person should only clean and disinfect the area around you (such as your bedroom and bathroom) on an as needed basis. Your caregiver/other person should wait as long as possible (at least several hours) and wear a mask before entering, cleaning, and disinfecting shared spaces that you use. Clean and disinfect areas that may have blood, stool, or body fluids on them. Use household cleaners and disinfectants. Clean visible dirty surfaces with household cleaners containing soap or detergent. Then, use a household disinfectant. Use a product from [employer] List N: Disinfectants for Coronavirus (KGURK-27). Be sure to follow the instructions on the label to ensure safe and effective use of the product. Many products recommend keeping the surface wet with a disinfectant for a certain period of time (look at "contact time" on the product label). You may also need to wear personal protective equipment, such as gloves, depending on the directions on the product label. Immediately after disinfecting, wash your hands with soap and water for 20 seconds. For completed guidance on cleaning and disinfecting your home, visit Complete Disinfection Guidance. Take steps to improve ventilation at home Improve ventilation (air flow) at home to help prevent from spreading COVID-19 to other people in your household. Clear out COVID-19 virus particles in the air by opening windows, using air filters, and turning on fans in your home. Use this interactive tool to learn how to improve air flow in your home. When you can be around others after being sick with COVID-19 Deciding when you can be around others is different for different situations. Find out when you can safely end home isolation. For any additional questions about your care, contact your healthcare provider or state or local health  department. 08/27/2020 Content source: [facility] ([facility]), Division of Viral Diseases This information is not intended to replace advice given to you by your health care provider. Make sure you discuss any questions you have with your health care provider. Document Revised: 10/10/2020 Document Reviewed: 10/10/2020 Elsevier Patient Education  [place].

## 2021-10-30 NOTE — Progress Notes (Signed)
Virtual Visit Consent   [name], you are scheduled for a virtual visit with a [facility] provider today. Just as with appointments in the office, your consent must be obtained to participate. Your consent will be active for this visit and any virtual visit you may have with one of our providers in the next 365 days. If you have a MyChart account, a copy of this consent can be sent to you electronically.  As this is a virtual visit, video technology does not allow for your provider to perform a traditional examination. This may limit your provider's ability to fully assess your condition. If your provider identifies any concerns that need to be evaluated in person or the need to arrange testing (such as labs, EKG, etc.), we will make arrangements to do so. Although advances in technology are sophisticated, we cannot ensure that it will always work on either your end or our end. If the connection with a video visit is poor, the visit may have to be switched to a telephone visit. With either a video or telephone visit, we are not always able to ensure that we have a secure connection.  By engaging in this virtual visit, you consent to the provision of healthcare and authorize for your insurance to be billed (if applicable) for the services provided during this visit. Depending on your insurance coverage, you may receive a charge related to this service.  I need to obtain your verbal consent now. Are you willing to proceed with your visit today? [name] has provided verbal consent on 10/30/2021 for a virtual visit (video or telephone). [name], NP  Date: 10/30/2021 10:15 AM  Virtual Visit via Video Note   I, [name], connected with  Maria Silva  ([id number], 29-Nov-1985) on 10/30/21 at 10:15 AM EDT by a video-enabled telemedicine application and verified that I am speaking with the correct person using two identifiers.  Location: Patient: Virtual Visit  Location Patient: Home Provider: Virtual Visit Location Provider: Home Office   I discussed the limitations of evaluation and management by telemedicine and the availability of in person appointments. The patient expressed understanding and agreed to proceed.    History of Present Illness: Maria Silva is a 36 y.o. who identifies as a female who was assigned female at birth, and is being seen today for covid + Symptoms started late Saturday overnight- started with a bad headache. Thought is was her typical migraine and was treating it as such. Monday started with diarrhea. Has body aches and pains. Has tried OTC for diarrhea. Nasal congestion and cough now. Unsure of fever.  Denies chest pain, shortness of breath, ear pain.  Problems:  Patient Active Problem List   Diagnosis Date Noted   Vitamin D deficiency 10/12/2017   Elevated cholesterol 10/12/2017    Allergies:  Allergies  Allergen Reactions   Amoxicillin Nausea And Vomiting and Rash   Tape Rash   Vicodin [Hydrocodone-Acetaminophen] Nausea And Vomiting and Rash   Medications:  Current Outpatient Medications:    albuterol (VENTOLIN HFA) 108 (90 Base) MCG/ACT inhaler, Inhale 2 puffs into the lungs every 6 (six) hours as needed for wheezing or shortness of breath., Disp: 1 each, Rfl: 4   budesonide-formoterol (SYMBICORT) 160-4.5 MCG/ACT inhaler, Inhale 2 puffs into the lungs 2 (two) times daily., Disp: 1 each, Rfl: 6   buPROPion (WELLBUTRIN XL) 150 MG 24 hr tablet, Take 1 tablet (150 mg total) by mouth in the morning, at  noon, and at bedtime., Disp: 270 tablet, Rfl: 2   buPROPion HCl ER, XL, 450 MG TB24, Take 150 mg by mouth in the morning, at noon, and at bedtime., Disp: 180 tablet, Rfl: 3   cephALEXin (KEFLEX) 250 MG capsule, Take 1 capsule (250 mg total) by mouth daily., Disp: 90 capsule, Rfl: 1   cephALEXin (KEFLEX) 500 MG capsule, Take 1 capsule (500 mg total) by mouth 3 (three) times daily., Disp: 21 capsule, Rfl: 0    cetirizine (ZYRTEC) 10 MG tablet, Take by mouth., Disp: , Rfl:    estradiol (ESTRACE) 2 MG tablet, Take 1 tablet (2 mg total) by mouth daily., Disp: 90 tablet, Rfl: 3   progesterone (PROMETRIUM) 200 MG capsule, TAKE 1 CAPSULE BY MOUTH EVERY DAY, Disp: 90 capsule, Rfl: 1   topiramate (TOPAMAX) 100 MG tablet, TAKE 1&1/2 TABLETS BY MOUTH ONCE DAILY, Disp: 135 tablet, Rfl: 2  Observations/Objective: Patient is well-developed, well-nourished in no acute distress.  Resting comfortably  at home.  Head is normocephalic, atraumatic.  No labored breathing.  Speech is clear and coherent with logical content.  Patient is alert and oriented at baseline.  Cough noted   Assessment and Plan: 1. COVID-19  - promethazine-dextromethorphan (PROMETHAZINE-DM) 6.25-15 MG/5ML syrup; Take 5 mLs by mouth 3 (three) times daily as needed for cough.  Dispense: 118 mL; Refill: 0 - benzonatate (TESSALON) 100 MG capsule; Take 1 capsule (100 mg total) by mouth 2 (two) times daily as needed for cough.  Dispense: 20 capsule; Refill: 0 - fluticasone (FLONASE) 50 MCG/ACT nasal spray; Place 2 sprays into both nostrils daily.  Dispense: 16 g; Refill: 0  S&S are consistent for COVID with GI symptoms as main ones. Advised imodium for diarrhea Will treat cough and congestion as per above COVID info reviewed and on avs Work note provided    Reviewed side effects, risks and benefits of medication.    Patient acknowledged agreement and understanding of the plan.     Follow Up Instructions: I discussed the assessment and treatment plan with the patient. The patient was provided an opportunity to ask questions and all were answered. The patient agreed with the plan and demonstrated an understanding of the instructions.  A copy of instructions were sent to the patient via MyChart unless otherwise noted below.     The patient was advised to call back or seek an in-person evaluation if the symptoms worsen or if the condition  fails to improve as anticipated.  Time:  I spent 15 minutes with the patient via telehealth technology discussing the above problems/concerns.    [name], NP

## 2021-10-30 NOTE — Progress Notes (Signed)
[place]   COVID + needs VV to discuss treatment and symptoms  Message sent with detail of this

## 2021-11-03 ENCOUNTER — Telehealth: Payer: Medicaid Other | Admitting: Physician Assistant

## 2021-11-03 DIAGNOSIS — U071 COVID-19: Secondary | ICD-10-CM

## 2021-11-03 MED ORDER — ONDANSETRON 4 MG PO TBDP: 4.0000 mg | ORAL_TABLET | Freq: Three times a day (TID) | ORAL | 0 refills | Status: AC | PRN

## 2021-11-03 MED ORDER — OMEPRAZOLE 40 MG PO CPDR: 40.0000 mg | DELAYED_RELEASE_CAPSULE | Freq: Every day | ORAL | 3 refills | Status: DC

## 2021-11-03 NOTE — Progress Notes (Signed)
Virtual Visit Consent   [facility], you are scheduled for a virtual visit with a [facility] provider today. Just as with appointments in the office, your consent must be obtained to participate. Your consent will be active for this visit and any virtual visit you may have with one of our providers in the next 365 days. If you have a MyChart account, a copy of this consent can be sent to you electronically.  As this is a virtual visit, video technology does not allow for your provider to perform a traditional examination. This may limit your provider's ability to fully assess your condition. If your provider identifies any concerns that need to be evaluated in person or the need to arrange testing (such as labs, EKG, etc.), we will make arrangements to do so. Although advances in technology are sophisticated, we cannot ensure that it will always work on either your end or our end. If the connection with a video visit is poor, the visit may have to be switched to a telephone visit. With either a video or telephone visit, we are not always able to ensure that we have a secure connection.  By engaging in this virtual visit, you consent to the provision of healthcare and authorize for your insurance to be billed (if applicable) for the services provided during this visit. Depending on your insurance coverage, you may receive a charge related to this service.  I need to obtain your verbal consent now. Are you willing to proceed with your visit today? Maria Silva has provided verbal consent on 11/03/2021 for a virtual visit (video or telephone). Mar Daring, PA-C  Date: 11/03/2021 2:06 PM  Virtual Visit via Video Note   I, Mar Daring, connected with  Maria Silva  ([id number], 04-Oct-1985) on 11/03/21 at  2:00 PM EDT by a video-enabled telemedicine application and verified that I am speaking with the correct person using two identifiers.  Location: Patient: Virtual  Visit Location Patient: Home Provider: Virtual Visit Location Provider: Home Office   I discussed the limitations of evaluation and management by telemedicine and the availability of in person appointments. The patient expressed understanding and agreed to proceed.    History of Present Illness: Maria Silva is a 36 y.o. who identifies as a female who was assigned female at birth, and is being seen today for continued headaches and fatigue following Covid 19. Was seen Virt[facility] on 10/30/21 for Covid 19 and was given symptomatic management with flonase, tessalon perles, and Promethazine DM. Symptoms had started on Sunday morning early (overnight Saturday), 10/26/21. She has had continued migraines and nausea. She has been trying to eat, but reports feels full quickly and will vomit after almost every meal. She is taking her daily migraine medications without relief.    Problems:  Patient Active Problem List   Diagnosis Date Noted   Vitamin D deficiency 10/12/2017   Elevated cholesterol 10/12/2017    Allergies:  Allergies  Allergen Reactions   Amoxicillin Nausea And Vomiting and Rash   Tape Rash   Vicodin [Hydrocodone-Acetaminophen] Nausea And Vomiting and Rash   Medications:  Current Outpatient Medications:    omeprazole (PRILOSEC) 40 MG capsule, Take 1 capsule (40 mg total) by mouth daily., Disp: 30 capsule, Rfl: 3   ondansetron (ZOFRAN-ODT) 4 MG disintegrating tablet, Take 1 tablet (4 mg total) by mouth every 8 (eight) hours as needed for nausea or vomiting., Disp: 20 tablet, Rfl: 0   albuterol (VENTOLIN HFA) 108 (  90 Base) MCG/ACT inhaler, Inhale 2 puffs into the lungs every 6 (six) hours as needed for wheezing or shortness of breath., Disp: 1 each, Rfl: 4   benzonatate (TESSALON) 100 MG capsule, Take 1 capsule (100 mg total) by mouth 2 (two) times daily as needed for cough., Disp: 20 capsule, Rfl: 0   budesonide-formoterol (SYMBICORT) 160-4.5 MCG/ACT inhaler, Inhale 2 puffs into  the lungs 2 (two) times daily., Disp: 1 each, Rfl: 6   buPROPion (WELLBUTRIN XL) 150 MG 24 hr tablet, Take 1 tablet (150 mg total) by mouth in the morning, at noon, and at bedtime., Disp: 270 tablet, Rfl: 2   buPROPion HCl ER, XL, 450 MG TB24, Take 150 mg by mouth in the morning, at noon, and at bedtime., Disp: 180 tablet, Rfl: 3   cephALEXin (KEFLEX) 250 MG capsule, Take 1 capsule (250 mg total) by mouth daily., Disp: 90 capsule, Rfl: 1   cephALEXin (KEFLEX) 500 MG capsule, Take 1 capsule (500 mg total) by mouth 3 (three) times daily., Disp: 21 capsule, Rfl: 0   cetirizine (ZYRTEC) 10 MG tablet, Take by mouth., Disp: , Rfl:    estradiol (ESTRACE) 2 MG tablet, Take 1 tablet (2 mg total) by mouth daily., Disp: 90 tablet, Rfl: 3   fluticasone (FLONASE) 50 MCG/ACT nasal spray, Place 2 sprays into both nostrils daily., Disp: 16 g, Rfl: 0   progesterone (PROMETRIUM) 200 MG capsule, TAKE 1 CAPSULE BY MOUTH EVERY DAY, Disp: 90 capsule, Rfl: 1   promethazine-dextromethorphan (PROMETHAZINE-DM) 6.25-15 MG/5ML syrup, Take 5 mLs by mouth 3 (three) times daily as needed for cough., Disp: 118 mL, Rfl: 0   topiramate (TOPAMAX) 100 MG tablet, TAKE 1&1/2 TABLETS BY MOUTH ONCE DAILY, Disp: 135 tablet, Rfl: 2  Observations/Objective: Patient is well-developed, well-nourished in no acute distress.  Resting comfortably at home.  Head is normocephalic, atraumatic.  No labored breathing.  Speech is clear and coherent with logical content.  Patient is alert and oriented at baseline.    Assessment and Plan: 1. COVID-19 - ondansetron (ZOFRAN-ODT) 4 MG disintegrating tablet; Take 1 tablet (4 mg total) by mouth every 8 (eight) hours as needed for nausea or vomiting.  Dispense: 20 tablet; Refill: 0 - omeprazole (PRILOSEC) 40 MG capsule; Take 1 capsule (40 mg total) by mouth daily.  Dispense: 30 capsule; Refill: 3  - Continued symptoms - Add zofran before meals - Omeprazole for possible gastritis - Discussed adding B  complex, Vit C, Vit D and zinc for fatigue and building immune system back up - Discussed prednisone for migraines, but politely declines as this worsens migraines - Push fluids, electrolyte beverages for dehydration - Seek in person evaluation if continues to worsen or fails to improve  Follow Up Instructions: I discussed the assessment and treatment plan with the patient. The patient was provided an opportunity to ask questions and all were answered. The patient agreed with the plan and demonstrated an understanding of the instructions.  A copy of instructions were sent to the patient via MyChart unless otherwise noted below.    The patient was advised to call back or seek an in-person evaluation if the symptoms worsen or if the condition fails to improve as anticipated.  Time:  I spent 14 minutes with the patient via telehealth technology discussing the above problems/concerns.    Mar Daring, PA-C

## 2021-11-03 NOTE — Patient Instructions (Signed)
[employer], thank you for joining Mar Daring, PA-C for today's virtual visit.  While this provider is not your primary care provider (PCP), if your PCP is located in our provider database this encounter information will be shared with them immediately following your visit.  Consent: (Patient) Maria Silva provided verbal consent for this virtual visit at the beginning of the encounter.  Current Medications:  Current Outpatient Medications:    omeprazole (PRILOSEC) 40 MG capsule, Take 1 capsule (40 mg total) by mouth daily., Disp: 30 capsule, Rfl: 3   ondansetron (ZOFRAN-ODT) 4 MG disintegrating tablet, Take 1 tablet (4 mg total) by mouth every 8 (eight) hours as needed for nausea or vomiting., Disp: 20 tablet, Rfl: 0   albuterol (VENTOLIN HFA) 108 (90 Base) MCG/ACT inhaler, Inhale 2 puffs into the lungs every 6 (six) hours as needed for wheezing or shortness of breath., Disp: 1 each, Rfl: 4   benzonatate (TESSALON) 100 MG capsule, Take 1 capsule (100 mg total) by mouth 2 (two) times daily as needed for cough., Disp: 20 capsule, Rfl: 0   budesonide-formoterol (SYMBICORT) 160-4.5 MCG/ACT inhaler, Inhale 2 puffs into the lungs 2 (two) times daily., Disp: 1 each, Rfl: 6   buPROPion (WELLBUTRIN XL) 150 MG 24 hr tablet, Take 1 tablet (150 mg total) by mouth in the morning, at noon, and at bedtime., Disp: 270 tablet, Rfl: 2   buPROPion HCl ER, XL, 450 MG TB24, Take 150 mg by mouth in the morning, at noon, and at bedtime., Disp: 180 tablet, Rfl: 3   cephALEXin (KEFLEX) 250 MG capsule, Take 1 capsule (250 mg total) by mouth daily., Disp: 90 capsule, Rfl: 1   cephALEXin (KEFLEX) 500 MG capsule, Take 1 capsule (500 mg total) by mouth 3 (three) times daily., Disp: 21 capsule, Rfl: 0   cetirizine (ZYRTEC) 10 MG tablet, Take by mouth., Disp: , Rfl:    estradiol (ESTRACE) 2 MG tablet, Take 1 tablet (2 mg total) by mouth daily., Disp: 90 tablet, Rfl: 3   fluticasone (FLONASE) 50  MCG/ACT nasal spray, Place 2 sprays into both nostrils daily., Disp: 16 g, Rfl: 0   progesterone (PROMETRIUM) 200 MG capsule, TAKE 1 CAPSULE BY MOUTH EVERY DAY, Disp: 90 capsule, Rfl: 1   promethazine-dextromethorphan (PROMETHAZINE-DM) 6.25-15 MG/5ML syrup, Take 5 mLs by mouth 3 (three) times daily as needed for cough., Disp: 118 mL, Rfl: 0   topiramate (TOPAMAX) 100 MG tablet, TAKE 1&1/2 TABLETS BY MOUTH ONCE DAILY, Disp: 135 tablet, Rfl: 2   Medications ordered in this encounter:  Meds ordered this encounter  Medications   ondansetron (ZOFRAN-ODT) 4 MG disintegrating tablet    Sig: Take 1 tablet (4 mg total) by mouth every 8 (eight) hours as needed for nausea or vomiting.    Dispense:  20 tablet    Refill:  0    Order Specific Question:   Supervising Provider    Answer:   [name] Heck, BRIAN [[id number]]   omeprazole (PRILOSEC) 40 MG capsule    Sig: Take 1 capsule (40 mg total) by mouth daily.    Dispense:  30 capsule    Refill:  3    Order Specific Question:   Supervising Provider    Answer:   [name] Heck, [place]     *If you need refills on other medications prior to your next appointment, please contact your pharmacy*  Follow-Up: Call back or seek an in-person evaluation if the symptoms worsen or if the condition fails to improve as  anticipated.  Other Instructions Vit D, Vit C, Zinc, B complex for rebuilding immune system and helping fatigue Electrolyte beverages for dehydration   If you have been instructed to have an in-person evaluation today at a local Urgent Care facility, please use the link below. It will take you to a list of all of our available [facility], including address, phone number and hours of operation. Please do not delay care.  [facility]  If you or a family member do not have a primary care provider, use the link below to schedule a visit and establish care. When you choose a [facility] primary care physician or advanced practice  provider, you gain a long-term partner in health. Find a Primary Care Provider  Learn more about 's in-office and virtual care options: [facility]

## 2021-11-28 ENCOUNTER — Telehealth: Payer: Medicaid Other | Admitting: Physician Assistant

## 2021-11-28 DIAGNOSIS — B9689 Other specified bacterial agents as the cause of diseases classified elsewhere: Secondary | ICD-10-CM

## 2021-11-28 DIAGNOSIS — N760 Acute vaginitis: Secondary | ICD-10-CM | POA: Diagnosis not present

## 2021-11-28 MED ORDER — METRONIDAZOLE 500 MG PO TABS: 500.0000 mg | ORAL_TABLET | Freq: Two times a day (BID) | ORAL | 0 refills | Status: DC

## 2022-01-07 ENCOUNTER — Other Ambulatory Visit: Payer: Self-pay | Admitting: Obstetrics and Gynecology

## 2022-01-08 ENCOUNTER — Telehealth: Payer: Medicaid Other | Admitting: Family Medicine

## 2022-01-08 DIAGNOSIS — N760 Acute vaginitis: Secondary | ICD-10-CM | POA: Diagnosis not present

## 2022-01-08 DIAGNOSIS — B9689 Other specified bacterial agents as the cause of diseases classified elsewhere: Secondary | ICD-10-CM | POA: Diagnosis not present

## 2022-01-08 MED ORDER — BUPROPION HCL ER (XL) 150 MG PO TB24: 150.0000 mg | ORAL_TABLET | Freq: Every day | ORAL | 3 refills | Status: DC

## 2022-01-08 MED ORDER — METRONIDAZOLE 500 MG PO TABS: 500.0000 mg | ORAL_TABLET | Freq: Two times a day (BID) | ORAL | 0 refills | Status: AC

## 2022-01-08 NOTE — Progress Notes (Signed)

## 2022-01-12 NOTE — Telephone Encounter (Signed)
Sent patient message via MyChart

## 2022-01-19 ENCOUNTER — Encounter: Payer: Self-pay | Admitting: Family Medicine

## 2022-01-19 ENCOUNTER — Telehealth: Payer: Medicaid Other | Admitting: Nurse Practitioner

## 2022-01-19 ENCOUNTER — Other Ambulatory Visit: Payer: Self-pay | Admitting: Obstetrics and Gynecology

## 2022-01-19 DIAGNOSIS — N3000 Acute cystitis without hematuria: Secondary | ICD-10-CM | POA: Diagnosis not present

## 2022-01-19 MED ORDER — SULFAMETHOXAZOLE-TRIMETHOPRIM 800-160 MG PO TABS: 1.0000 | ORAL_TABLET | Freq: Two times a day (BID) | ORAL | 0 refills | Status: AC

## 2022-01-19 NOTE — Progress Notes (Signed)
E-Visit for Urinary Problems  We are sorry that you are not feeling well.  Here is how we plan to help!  Based on your history it would be best if this starts recurring to have an in person evaluation and urine culture. The most recent urine culture that we can see showed resistance to Macrobid.   If you need help finding a new primary care doctor you can visit [facility]'s website and on the right side you can schedule as a new patient and find a doctor and schedule right online.   For today we will prescribe   Meds ordered this encounter  Medications   sulfamethoxazole-trimethoprim (BACTRIM DS) 800-160 MG tablet    Sig: Take 1 tablet by mouth 2 (two) times daily for 5 days.    Dispense:  10 tablet    Refill:  0     A UTI (Urinary Tract Infection) is a bacterial infection of the bladder.  Most cases of urinary tract infections are simple to treat but a key part of your care is to encourage you to drink plenty of fluids and watch your symptoms carefully.  I have prescribed Bactrim DS One tablet twice a day for 5 days.  Your symptoms should gradually improve. Call us if the burning in your urine worsens, you develop worsening fever, back pain or pelvic pain or if your symptoms do not resolve after completing the antibiotic.  Urinary tract infections can be prevented by drinking plenty of water to keep your body hydrated.  Also be sure when you wipe, wipe from front to back and don't hold it in!  If possible, empty your bladder every 4 hours.  HOME CARE Drink plenty of fluids Compete the full course of the antibiotics even if the symptoms resolve Remember, when you need to go.go. Holding in your urine can increase the likelihood of getting a UTI! GET HELP RIGHT AWAY IF: You cannot urinate You get a high fever Worsening back pain occurs You see blood in your urine You feel sick to your stomach or throw up You feel like you are going to pass out  MAKE SURE YOU  Understand  these instructions. Will watch your condition. Will get help right away if you are not doing well or get worse.   Thank you for choosing an e-visit.  Your e-visit answers were reviewed by a board certified advanced clinical practitioner to complete your personal care plan. Depending upon the condition, your plan could have included both over the counter or prescription medications.  Please review your pharmacy choice. Make sure the pharmacy is open so you can pick up prescription now. If there is a problem, you may contact your provider through [employer] and have the prescription routed to another pharmacy.  Your safety is important to [place]. If you have drug allergies check your prescription carefully.   For the next 24 hours you can use MyChart to ask questions about today's visit, request a non-urgent call back, or ask for a work or school excuse. You will get an email in the next two days asking about your experience. I hope that your e-visit has been valuable and will speed your recovery.   I spent approximately 7 minutes reviewing the patient's history, current symptoms and coordinating their plan of care today.

## 2022-01-19 NOTE — Telephone Encounter (Signed)
Patient needs annual exam prior to next refill.  

## 2022-01-19 NOTE — Telephone Encounter (Signed)
Called and spoke with patient about scheduling her annual exam for August 23,23 at 11am. Pt states she will return a call to the office once she can overlook her schedule.

## 2022-02-06 ENCOUNTER — Telehealth: Payer: Medicaid Other | Admitting: Physician Assistant

## 2022-02-06 DIAGNOSIS — N760 Acute vaginitis: Secondary | ICD-10-CM | POA: Diagnosis not present

## 2022-02-06 DIAGNOSIS — B9689 Other specified bacterial agents as the cause of diseases classified elsewhere: Secondary | ICD-10-CM

## 2022-02-06 MED ORDER — METRONIDAZOLE 500 MG PO TABS: 500.0000 mg | ORAL_TABLET | Freq: Two times a day (BID) | ORAL | 0 refills | Status: DC

## 2022-02-06 NOTE — Progress Notes (Signed)

## 2022-02-06 NOTE — Progress Notes (Signed)
I have spent 5 minutes in review of e-visit questionnaire, review and updating patient chart, medical decision making and response to patient.   Aminat Shelburne Cody Cammy Sanjurjo, PA-C    

## 2022-02-16 ENCOUNTER — Telehealth: Payer: Medicaid Other | Admitting: Physician Assistant

## 2022-02-16 DIAGNOSIS — R300 Dysuria: Secondary | ICD-10-CM

## 2022-02-17 MED ORDER — NITROFURANTOIN MONOHYD MACRO 100 MG PO CAPS: 100.0000 mg | ORAL_CAPSULE | Freq: Two times a day (BID) | ORAL | 0 refills | Status: DC

## 2022-02-17 NOTE — Progress Notes (Addendum)

## 2022-02-17 NOTE — Progress Notes (Signed)
I have spent 5 minutes in review of e-visit questionnaire, review and updating patient chart, medical decision making and response to patient.   Maria Silva Cody Priya Matsen, PA-C    

## 2022-02-17 NOTE — Addendum Note (Signed)
Addended by: Brunetta Jeans on: 02/17/2022 11:08 AM   Modules accepted: Orders

## 2022-03-12 ENCOUNTER — Telehealth: Payer: Medicaid Other | Admitting: Family Medicine

## 2022-03-12 DIAGNOSIS — R3989 Other symptoms and signs involving the genitourinary system: Secondary | ICD-10-CM

## 2022-03-12 NOTE — Progress Notes (Signed)
[place]   Recent UTI with tx, needs to have get a in person for sample collection needs and testing.

## 2022-06-01 ENCOUNTER — Telehealth: Payer: Medicaid Other | Admitting: Physician Assistant

## 2022-06-01 DIAGNOSIS — N390 Urinary tract infection, site not specified: Secondary | ICD-10-CM

## 2022-06-02 MED ORDER — NITROFURANTOIN MONOHYD MACRO 100 MG PO CAPS: 100.0000 mg | ORAL_CAPSULE | Freq: Two times a day (BID) | ORAL | 0 refills | Status: DC

## 2022-06-02 NOTE — Progress Notes (Signed)
E-Visit for Urinary Problems  We are sorry that you are not feeling well.  Here is how we plan to help!  Based on what you shared with me it looks like you most likely have a simple urinary tract infection.  A UTI (Urinary Tract Infection) is a bacterial infection of the bladder.  Most cases of urinary tract infections are simple to treat but a key part of your care is to encourage you to drink plenty of fluids and watch your symptoms carefully.  I see you have had several telemedicine visits for this issue in the past few months. Because of the holiday, I have prescribed MacroBid 100 mg twice a day for 5 days, but you will need to follow-up in person for any recurring urinary issues as you may need some preventive measures/further evaluation.    Your symptoms should gradually improve. Call us if the burning in your urine worsens, you develop worsening fever, back pain or pelvic pain or if your symptoms do not resolve after completing the antibiotic.  Urinary tract infections can be prevented by drinking plenty of water to keep your body hydrated.  Also be sure when you wipe, wipe from front to back and don't hold it in!  If possible, empty your bladder every 4 hours.  HOME CARE Drink plenty of fluids Compete the full course of the antibiotics even if the symptoms resolve Remember, when you need to go.go. Holding in your urine can increase the likelihood of getting a UTI! GET HELP RIGHT AWAY IF: You cannot urinate You get a high fever Worsening back pain occurs You see blood in your urine You feel sick to your stomach or throw up You feel like you are going to pass out  MAKE SURE YOU  Understand these instructions. Will watch your condition. Will get help right away if you are not doing well or get worse.   Thank you for choosing an e-visit.  Your e-visit answers were reviewed by a board certified advanced clinical practitioner to complete your personal care plan. Depending upon  the condition, your plan could have included both over the counter or prescription medications.  Please review your pharmacy choice. Make sure the pharmacy is open so you can pick up prescription now. If there is a problem, you may contact your provider through [employer] and have the prescription routed to another pharmacy.  Your safety is important to [place]. If you have drug allergies check your prescription carefully.   For the next 24 hours you can use MyChart to ask questions about today's visit, request a non-urgent call back, or ask for a work or school excuse. You will get an email in the next two days asking about your experience. I hope that your e-visit has been valuable and will speed your recovery.

## 2022-06-02 NOTE — Progress Notes (Signed)
I have spent 5 minutes in review of e-visit questionnaire, review and updating patient chart, medical decision making and response to patient.   Brielle Moro Cody Deah Ottaway, PA-C    

## 2022-07-01 ENCOUNTER — Telehealth: Payer: Medicaid Other | Admitting: Physician Assistant

## 2022-07-01 ENCOUNTER — Other Ambulatory Visit: Payer: Self-pay | Admitting: Obstetrics and Gynecology

## 2022-07-01 DIAGNOSIS — R3989 Other symptoms and signs involving the genitourinary system: Secondary | ICD-10-CM

## 2022-07-01 MED ORDER — SULFAMETHOXAZOLE-TRIMETHOPRIM 800-160 MG PO TABS: 1.0000 | ORAL_TABLET | Freq: Two times a day (BID) | ORAL | 0 refills | Status: DC

## 2022-07-01 NOTE — Progress Notes (Signed)
E-Visit for Urinary Problems  We are sorry that you are not feeling well.  Here is how we plan to help!  Based on what you shared with me it looks like you most likely have a simple urinary tract infection.  A UTI (Urinary Tract Infection) is a bacterial infection of the bladder.  Most cases of urinary tract infections are simple to treat but a key part of your care is to encourage you to drink plenty of fluids and watch your symptoms carefully.  I have prescribed Bactrim DS One tablet twice a day for 5 days.  Your symptoms should gradually improve. Call us if the burning in your urine worsens, you develop worsening fever, back pain or pelvic pain or if your symptoms do not resolve after completing the antibiotic.  Urinary tract infections can be prevented by drinking plenty of water to keep your body hydrated.  Also be sure when you wipe, wipe from front to back and don't hold it in!  If possible, empty your bladder every 4 hours.  HOME CARE Drink plenty of fluids Compete the full course of the antibiotics even if the symptoms resolve Remember, when you need to go.go. Holding in your urine can increase the likelihood of getting a UTI! GET HELP RIGHT AWAY IF: You cannot urinate You get a high fever Worsening back pain occurs You see blood in your urine You feel sick to your stomach or throw up You feel like you are going to pass out  MAKE SURE YOU  Understand these instructions. Will watch your condition. Will get help right away if you are not doing well or get worse.   Thank you for choosing an e-visit.  Your e-visit answers were reviewed by a board certified advanced clinical practitioner to complete your personal care plan. Depending upon the condition, your plan could have included both over the counter or prescription medications.  Please review your pharmacy choice. Make sure the pharmacy is open so you can pick up prescription now. If there is a problem, you may contact  your provider through [employer] and have the prescription routed to another pharmacy.  Your safety is important to [place]. If you have drug allergies check your prescription carefully.   For the next 24 hours you can use MyChart to ask questions about today's visit, request a non-urgent call back, or ask for a work or school excuse. You will get an email in the next two days asking about your experience. I hope that your e-visit has been valuable and will speed your recovery.

## 2022-07-01 NOTE — Progress Notes (Signed)
I have spent 5 minutes in review of e-visit questionnaire, review and updating patient chart, medical decision making and response to patient.   Deward Sebek Cody Fadumo Heng, PA-C    

## 2022-07-25 ENCOUNTER — Other Ambulatory Visit: Payer: Self-pay | Admitting: Obstetrics and Gynecology

## 2022-07-27 ENCOUNTER — Other Ambulatory Visit: Payer: Self-pay | Admitting: Obstetrics and Gynecology

## 2022-08-01 ENCOUNTER — Telehealth: Payer: Medicaid Other | Admitting: Nurse Practitioner

## 2022-08-01 DIAGNOSIS — B3731 Acute candidiasis of vulva and vagina: Secondary | ICD-10-CM

## 2022-08-01 MED ORDER — FLUCONAZOLE 150 MG PO TABS: 150.0000 mg | ORAL_TABLET | Freq: Once | ORAL | 0 refills | Status: AC

## 2022-08-01 NOTE — Progress Notes (Signed)

## 2022-08-02 ENCOUNTER — Telehealth: Payer: Medicaid Other | Admitting: Physician Assistant

## 2022-08-02 DIAGNOSIS — B9689 Other specified bacterial agents as the cause of diseases classified elsewhere: Secondary | ICD-10-CM

## 2022-08-03 MED ORDER — METRONIDAZOLE 500 MG PO TABS: 500.0000 mg | ORAL_TABLET | Freq: Two times a day (BID) | ORAL | 0 refills | Status: DC

## 2022-08-03 NOTE — Progress Notes (Signed)
E-Visit for Vaginal Symptoms  We are sorry that you are not feeling well. Here is how we plan to help! Based on what you shared with me it looks like you: May have a vaginosis due to bacteria  Vaginosis is an inflammation of the vagina that can result in discharge, itching and pain. The cause is usually a change in the normal balance of vaginal bacteria or an infection. Vaginosis can also result from reduced estrogen levels after menopause.  The most common causes of vaginosis are:   Bacterial vaginosis which results from an overgrowth of one on several organisms that are normally present in your vagina.   Yeast infections which are caused by a naturally occurring fungus called candida.   Vaginal atrophy (atrophic vaginosis) which results from the thinning of the vagina from reduced estrogen levels after menopause.   Trichomoniasis which is caused by a parasite and is commonly transmitted by sexual intercourse.  Factors that increase your risk of developing vaginosis include: Medications, such as antibiotics and steroids Uncontrolled diabetes Use of hygiene products such as bubble bath, vaginal spray or vaginal deodorant Douching Wearing damp or tight-fitting clothing Using an intrauterine device (IUD) for birth control Hormonal changes, such as those associated with pregnancy, birth control pills or menopause Sexual activity Having a sexually transmitted infection  Your treatment plan is Metronidazole or Flagyl 500mg twice a day for 7 days.  I have electronically sent this prescription into the pharmacy that you have chosen.  Be sure to take all of the medication as directed. Stop taking any medication if you develop a rash, tongue swelling or shortness of breath. Mothers who are breast feeding should consider pumping and discarding their breast milk while on these antibiotics. However, there is no consensus that infant exposure at these doses would be harmful.  Remember that  medication creams can weaken latex condoms. .   HOME CARE:  Good hygiene may prevent some types of vaginosis from recurring and may relieve some symptoms:  Avoid baths, hot tubs and whirlpool spas. Rinse soap from your outer genital area after a shower, and dry the area well to prevent irritation. Don't use scented or harsh soaps, such as those with deodorant or antibacterial action. Avoid irritants. These include scented tampons and pads. Wipe from front to back after using the toilet. Doing so avoids spreading fecal bacteria to your vagina.  Other things that may help prevent vaginosis include:  Don't douche. Your vagina doesn't require cleansing other than normal bathing. Repetitive douching disrupts the normal organisms that reside in the vagina and can actually increase your risk of vaginal infection. Douching won't clear up a vaginal infection. Use a latex condom. Both female and female latex condoms may help you avoid infections spread by sexual contact. Wear cotton underwear. Also wear pantyhose with a cotton crotch. If you feel comfortable without it, skip wearing underwear to bed. Yeast thrives in moist environments Your symptoms should improve in the next day or two.  GET HELP RIGHT AWAY IF:  You have pain in your lower abdomen ( pelvic area or over your ovaries) You develop nausea or vomiting You develop a fever Your discharge changes or worsens You have persistent pain with intercourse You develop shortness of breath, a rapid pulse, or you faint.  These symptoms could be signs of problems or infections that need to be evaluated by a medical provider now.  MAKE SURE YOU   Understand these instructions. Will watch your condition. Will get help right   away if you are not doing well or get worse.  Thank you for choosing an e-visit.  Your e-visit answers were reviewed by a board certified advanced clinical practitioner to complete your personal care plan. Depending upon the  condition, your plan could have included both over the counter or prescription medications.  Please review your pharmacy choice. Make sure the pharmacy is open so you can pick up prescription now. If there is a problem, you may contact your provider through MyChart messaging and have the prescription routed to another pharmacy.  Your safety is important to us. If you have drug allergies check your prescription carefully.   For the next 24 hours you can use MyChart to ask questions about today's visit, request a non-urgent call back, or ask for a work or school excuse. You will get an email in the next two days asking about your experience. I hope that your e-visit has been valuable and will speed your recovery.  I have spent 5 minutes in review of e-visit questionnaire, review and updating patient chart, medical decision making and response to patient.   Daylen Lipsky M Ellise Kovack, PA-C  

## 2022-08-13 ENCOUNTER — Ambulatory Visit: Payer: Medicaid Other | Admitting: Obstetrics and Gynecology

## 2022-08-14 ENCOUNTER — Other Ambulatory Visit: Payer: Self-pay | Admitting: Obstetrics and Gynecology

## 2022-08-17 ENCOUNTER — Other Ambulatory Visit: Payer: Self-pay | Admitting: Obstetrics and Gynecology

## 2022-08-18 MED ORDER — TOPIRAMATE 100 MG PO TABS: ORAL_TABLET | ORAL | 0 refills | Status: DC

## 2022-08-20 ENCOUNTER — Ambulatory Visit: Payer: Medicaid Other | Admitting: Obstetrics & Gynecology

## 2022-08-21 ENCOUNTER — Encounter: Payer: Self-pay | Admitting: Obstetrics & Gynecology

## 2022-08-21 ENCOUNTER — Ambulatory Visit (INDEPENDENT_AMBULATORY_CARE_PROVIDER_SITE_OTHER): Payer: Medicaid Other | Admitting: Obstetrics & Gynecology

## 2022-08-21 VITALS — BP 114/78 | HR 89 | Ht 64.0 in | Wt 109.0 lb

## 2022-08-21 DIAGNOSIS — R61 Generalized hyperhidrosis: Secondary | ICD-10-CM | POA: Diagnosis not present

## 2022-08-21 DIAGNOSIS — R002 Palpitations: Secondary | ICD-10-CM

## 2022-08-21 NOTE — Progress Notes (Signed)
    GYNECOLOGY PROGRESS NOTE  Subjective:    Patient ID: Maria Silva, female    DOB: 1985-10-14, 37 y.o.   MRN: [id number]  HPI  Patient is a 37 y.o. G18P0 female who presents for recurrent bv and a fishy smell. She recently was seen at urgent care and given Macrobid and this was then changed to bactrim.  She has generalized complaints- can't fall asleep, heart palpitations, night sweats.  She also reports a painful "lump/knot" in the area to the left of her mons pubis.  Review of Systems She had a hysterectomy and no longer needs pap smears. She works at [employer] at [employer].   Objective:   Blood pressure 114/78, pulse 89, height 5\' 4"  (1.626 m), weight 109 lb (49.4 kg). Body mass index is 18.71 kg/m. General appearance: alert Abdomen: soft, non-tender; bowel sounds normal; no masses,  no organomegaly Pelvic: external genitalia normal Extremities: extremities normal, atraumatic, no cyanosis or edema Neurologic: Grossly normal Spec exam reveals a discharge c/w BV. I asked her to show me (with her finger) where the painful "knot" is. Based on this, I was able to feel a small (maybe 20mm) area to the left of her mons. I do not know what this is. I offered to send her to a gen surg if she desires.  Assessment:   Symptoms of recurrent bv- I have discussed preventative treatment with nightly or QOD boric acid vaginal suppositories. I have prescribed flagyl. Because of the palpitations and night sweats, I will check TSH and FSH.   Plan:  As above I asked her if she prefers a follow up visit or if she prefers a [employer]. She prefers a message.

## 2022-08-22 ENCOUNTER — Other Ambulatory Visit: Payer: Self-pay | Admitting: Licensed Practical Nurse

## 2022-08-22 DIAGNOSIS — B9689 Other specified bacterial agents as the cause of diseases classified elsewhere: Secondary | ICD-10-CM

## 2022-08-22 LAB — FOLLICLE STIMULATING HORMONE: FSH: 45.9 m[IU]/mL

## 2022-08-22 LAB — TSH+FREE T4
Free T4: 1.34 ng/dL (ref 0.82–1.77)
TSH: 1.47 u[IU]/mL (ref 0.450–4.500)

## 2022-08-22 MED ORDER — METRONIDAZOLE 500 MG PO TABS: 500.0000 mg | ORAL_TABLET | Freq: Two times a day (BID) | ORAL | 0 refills | Status: DC

## 2022-08-22 NOTE — Progress Notes (Signed)
Pt seen by Dr Hulan Fray on 3/15 was prescribed Flagyl but order not sent to pharmacy. Script [facility] [place] in [place], [employer]  08/22/22  12:15 PM

## 2022-09-01 ENCOUNTER — Other Ambulatory Visit: Payer: Self-pay

## 2022-09-01 ENCOUNTER — Encounter: Payer: Self-pay | Admitting: Obstetrics and Gynecology

## 2022-09-13 ENCOUNTER — Telehealth: Payer: Medicaid Other | Admitting: Family

## 2022-09-13 DIAGNOSIS — R399 Unspecified symptoms and signs involving the genitourinary system: Secondary | ICD-10-CM

## 2022-09-13 MED ORDER — SULFAMETHOXAZOLE-TRIMETHOPRIM 800-160 MG PO TABS: 1.0000 | ORAL_TABLET | Freq: Two times a day (BID) | ORAL | 0 refills | Status: DC

## 2022-09-13 NOTE — Progress Notes (Signed)

## 2022-09-16 ENCOUNTER — Encounter: Payer: Self-pay | Admitting: Obstetrics and Gynecology

## 2022-09-16 NOTE — Telephone Encounter (Signed)
You last saw her in March for night sweats.

## 2022-09-18 ENCOUNTER — Encounter: Payer: Self-pay | Admitting: Obstetrics & Gynecology

## 2022-09-26 ENCOUNTER — Other Ambulatory Visit: Payer: Self-pay | Admitting: Obstetrics and Gynecology

## 2022-09-28 ENCOUNTER — Telehealth: Payer: Self-pay | Admitting: Obstetrics and Gynecology

## 2022-09-28 NOTE — Telephone Encounter (Signed)
Reached out to pt to schedule annual.  Could not leave message.  Home phone not in service, mobile phone mailbox is full.   

## 2022-09-28 NOTE — Telephone Encounter (Signed)
Reached out to pt to schedule annual.  Could not leave message.  Home phone not in service, mobile phone mailbox is full.

## 2022-09-29 ENCOUNTER — Telehealth: Payer: Medicaid Other | Admitting: Family Medicine

## 2022-09-29 ENCOUNTER — Other Ambulatory Visit: Payer: Self-pay

## 2022-09-29 DIAGNOSIS — R3989 Other symptoms and signs involving the genitourinary system: Secondary | ICD-10-CM

## 2022-09-29 NOTE — Progress Notes (Signed)
Because recent treatment for UTI in last month, I feel your condition warrants further evaluation and I recommend that you be seen in a face to face visit.   NOTE: There will be NO CHARGE for this eVisit

## 2022-09-30 NOTE — Telephone Encounter (Signed)
My chart message sent to patient via RX request.

## 2022-09-30 NOTE — Telephone Encounter (Signed)
The patient was contacted by [facility] on 4/22 at 2:41 pm. Voicemail was left for the patient to call back to be scheduled for annual

## 2022-10-01 ENCOUNTER — Other Ambulatory Visit: Payer: Self-pay

## 2022-10-01 MED ORDER — ESTRADIOL 2 MG PO TABS: 2.0000 mg | ORAL_TABLET | Freq: Every day | ORAL | 1 refills | Status: DC

## 2022-10-01 NOTE — Telephone Encounter (Signed)
The patient is scheduled for 11/03/22 with [facility]. Please advise refill request?

## 2022-10-01 NOTE — Telephone Encounter (Signed)
The patient is scheduled for annual with [facility] 11/03/22

## 2022-10-06 ENCOUNTER — Other Ambulatory Visit: Payer: Self-pay | Admitting: Obstetrics and Gynecology

## 2022-10-07 MED ORDER — TOPIRAMATE 100 MG PO TABS: ORAL_TABLET | ORAL | 3 refills | Status: DC

## 2022-10-10 ENCOUNTER — Telehealth: Payer: Medicaid Other | Admitting: Physician Assistant

## 2022-10-10 DIAGNOSIS — N3000 Acute cystitis without hematuria: Secondary | ICD-10-CM | POA: Diagnosis not present

## 2022-10-10 MED ORDER — NITROFURANTOIN MONOHYD MACRO 100 MG PO CAPS: 100.0000 mg | ORAL_CAPSULE | Freq: Two times a day (BID) | ORAL | 0 refills | Status: DC

## 2022-10-10 NOTE — Progress Notes (Signed)
E-Visit for Urinary Problems  We are sorry that you are not feeling well.  Here is how we plan to help!  Based on what you shared with me it looks like you most likely have a simple urinary tract infection.  A UTI (Urinary Tract Infection) is a bacterial infection of the bladder.  Most cases of urinary tract infections are simple to treat but a key part of your care is to encourage you to drink plenty of fluids and watch your symptoms carefully.  I have prescribed MacroBid 100 mg twice a day for 5 days.  Your symptoms should gradually improve. Call us if the burning in your urine worsens, you develop worsening fever, back pain or pelvic pain or if your symptoms do not resolve after completing the antibiotic.  Urinary tract infections can be prevented by drinking plenty of water to keep your body hydrated.  Also be sure when you wipe, wipe from front to back and don't hold it in!  If possible, empty your bladder every 4 hours.  HOME CARE Drink plenty of fluids Compete the full course of the antibiotics even if the symptoms resolve Remember, when you need to go.go. Holding in your urine can increase the likelihood of getting a UTI! GET HELP RIGHT AWAY IF: You cannot urinate You get a high fever Worsening back pain occurs You see blood in your urine You feel sick to your stomach or throw up You feel like you are going to pass out  MAKE SURE YOU  Understand these instructions. Will watch your condition. Will get help right away if you are not doing well or get worse.   Thank you for choosing an e-visit.  Your e-visit answers were reviewed by a board certified advanced clinical practitioner to complete your personal care plan. Depending upon the condition, your plan could have included both over the counter or prescription medications.  Please review your pharmacy choice. Make sure the pharmacy is open so you can pick up prescription now. If there is a problem, you may contact your  provider through MyChart messaging and have the prescription routed to another pharmacy.  Your safety is important to us. If you have drug allergies check your prescription carefully.   For the next 24 hours you can use MyChart to ask questions about today's visit, request a non-urgent call back, or ask for a work or school excuse. You will get an email in the next two days asking about your experience. I hope that your e-visit has been valuable and will speed your recovery.   I have spent 5 minutes in review of e-visit questionnaire, review and updating patient chart, medical decision making and response to patient.   Malone Vanblarcom Z Ward, PA-C    

## 2022-10-15 MED ORDER — NITROFURANTOIN MONOHYD MACRO 100 MG PO CAPS
100.0000 mg | ORAL_CAPSULE | Freq: Two times a day (BID) | ORAL | 0 refills | Status: AC
Start: 2022-10-15 — End: 2022-10-20

## 2022-10-15 NOTE — Addendum Note (Signed)
Addended by: Viviano Simas E on: 10/15/2022 11:30 AM   Modules accepted: Orders

## 2022-11-03 ENCOUNTER — Ambulatory Visit: Payer: Medicaid Other | Admitting: Obstetrics

## 2022-11-05 ENCOUNTER — Telehealth: Payer: Medicaid Other | Admitting: Family Medicine

## 2022-11-05 DIAGNOSIS — R300 Dysuria: Secondary | ICD-10-CM

## 2022-11-05 NOTE — Progress Notes (Signed)
Recent treatment in last 30 days for UTI  Needs in person eval this time.

## 2022-11-09 ENCOUNTER — Telehealth: Payer: Medicaid Other | Admitting: Physician Assistant

## 2022-11-09 DIAGNOSIS — R3989 Other symptoms and signs involving the genitourinary system: Secondary | ICD-10-CM

## 2022-11-09 NOTE — Progress Notes (Signed)
Because of recent UTI in the past month and recent e-visit on 5/30 where you were instructed to seek an in-person evaluation, I feel your condition warrants further evaluation and I recommend that you be seen in a face to face visit.   NOTE: There will be NO CHARGE for this eVisit   If you are having a true medical emergency please call 911.      For an urgent face to face visit, [place] has eight urgent care centers for your convenience:   NEW!! [facility] Health Urgent Care Center at [facility] Get Driving Directions [phone] [address], [address] [place], [address]    [facility] Health Urgent Care Center at [facility] Get Driving Directions [phone] [address] [place], [place] [address]   [facility] Health Urgent Care Center [facility]) Get Driving Directions [phone] [address] [place], [place] [address]  [facility] Health Urgent Care Center [facility]) Get Driving Directions [phone] [address] [place],  [place]  [address]  [facility] Health Urgent Care Center [facility] - at [facility]  [phone] [address] [place],  [place] [address]   [facility] Health Urgent Care at [facility] Get Driving Directions [phone] [address] [place], [place] [address]   [facility] Health Urgent Care at [facility] Get Driving Directions  [phone] [address] [place], [place] [address]   [facility] Health Urgent Care at [facility] Directions [phone] [address], [address] [place], [place] [address]  Your MyChart E-visit questionnaire answers were reviewed by a board certified advanced clinical practitioner to complete your personal care plan based on your specific symptoms.  Thank you for using e-Visits.

## 2022-11-22 ENCOUNTER — Telehealth: Payer: Medicaid Other | Admitting: Physician Assistant

## 2022-11-22 DIAGNOSIS — N390 Urinary tract infection, site not specified: Secondary | ICD-10-CM

## 2022-11-23 NOTE — Progress Notes (Signed)
Because this is a recurring/frequent issue with treatment via e-visit in the past few weeks, I feel your condition warrants further evaluation and I recommend that you be seen in a face to face visit. You need examination, urinalysis and culture to verify infection and the culprit bacteria so we can make sure it is properly treated.    NOTE: There will be NO CHARGE for this eVisit   If you are having a true medical emergency please call 911.      For an urgent face to face visit, [place] has eight urgent care centers for your convenience:   NEW!! [facility] Health Urgent Care Center at [facility] Get Driving Directions [phone] [address] [place], [address]    [facility] Health Urgent Care Center at [facility] Get Driving Directions [phone] [address] [place], [place] [address]   [facility] Health Urgent Care Center [facility]) Get Driving Directions [phone] [address] [place], [place] [address]  [facility] Health Urgent Care Center [facility]) Get Driving Directions [phone] [address] [place],  [place]  [address]  [facility] Health Urgent Care Center [facility] - at [employer]  [phone] [address] [place],  [place] [address]   [facility] Health Urgent Care at [facility] Get Driving Directions [phone] [address] [place], [place] [address]   [facility] Health Urgent Care at [facility] Get Driving Directions  [phone] [address] [place], [place] [address]   [facility] Health Urgent Care at [facility] Directions [phone] [address], [address] [place], [place] [address]  Your MyChart E-visit questionnaire answers were reviewed by a board certified advanced clinical practitioner to complete your personal care plan based on your specific symptoms.  Thank you for using e-Visits.

## 2022-12-04 ENCOUNTER — Other Ambulatory Visit (HOSPITAL_COMMUNITY)
Admission: RE | Admit: 2022-12-04 | Discharge: 2022-12-04 | Disposition: A | Discharge status: Home / Self Care | Payer: Medicaid Other | Source: Ambulatory Visit | Attending: Obstetrics and Gynecology | Admitting: Obstetrics and Gynecology

## 2022-12-04 ENCOUNTER — Encounter: Payer: Self-pay | Admitting: Licensed Practical Nurse

## 2022-12-04 ENCOUNTER — Ambulatory Visit (INDEPENDENT_AMBULATORY_CARE_PROVIDER_SITE_OTHER): Payer: Medicaid Other | Admitting: Licensed Practical Nurse

## 2022-12-04 VITALS — BP 98/76 | HR 71 | Ht 64.0 in | Wt 114.0 lb

## 2022-12-04 DIAGNOSIS — Z1151 Encounter for screening for human papillomavirus (HPV): Secondary | ICD-10-CM | POA: Insufficient documentation

## 2022-12-04 DIAGNOSIS — Z87440 Personal history of urinary (tract) infections: Secondary | ICD-10-CM | POA: Diagnosis not present

## 2022-12-04 DIAGNOSIS — R399 Unspecified symptoms and signs involving the genitourinary system: Secondary | ICD-10-CM | POA: Diagnosis not present

## 2022-12-04 DIAGNOSIS — Z124 Encounter for screening for malignant neoplasm of cervix: Secondary | ICD-10-CM | POA: Diagnosis present

## 2022-12-04 DIAGNOSIS — Z01411 Encounter for gynecological examination (general) (routine) with abnormal findings: Secondary | ICD-10-CM | POA: Diagnosis not present

## 2022-12-04 DIAGNOSIS — R1909 Other intra-abdominal and pelvic swelling, mass and lump: Secondary | ICD-10-CM

## 2022-12-04 DIAGNOSIS — N390 Urinary tract infection, site not specified: Secondary | ICD-10-CM

## 2022-12-04 DIAGNOSIS — Z01419 Encounter for gynecological examination (general) (routine) without abnormal findings: Secondary | ICD-10-CM | POA: Diagnosis present

## 2022-12-04 DIAGNOSIS — R87610 Atypical squamous cells of undetermined significance on cytologic smear of cervix (ASC-US): Secondary | ICD-10-CM | POA: Insufficient documentation

## 2022-12-04 DIAGNOSIS — Z113 Encounter for screening for infections with a predominantly sexual mode of transmission: Secondary | ICD-10-CM

## 2022-12-04 DIAGNOSIS — R350 Frequency of micturition: Secondary | ICD-10-CM | POA: Insufficient documentation

## 2022-12-04 DIAGNOSIS — R61 Generalized hyperhidrosis: Secondary | ICD-10-CM

## 2022-12-04 LAB — POCT URINALYSIS DIPSTICK
Bilirubin, UA: NEGATIVE
Blood, UA: NEGATIVE
Glucose, UA: NEGATIVE
Ketones, UA: NEGATIVE
Leukocytes, UA: NEGATIVE
Nitrite, UA: POSITIVE
Protein, UA: NEGATIVE
Spec Grav, UA: 1.025 (ref 1.010–1.025)
Urobilinogen, UA: 0.2 E.U./dL
pH, UA: 5 (ref 5.0–8.0)

## 2022-12-04 NOTE — Progress Notes (Unsigned)
Gynecology Annual Exam   PCP: Patient, No Pcp Per  Chief Complaint:  Chief Complaint  Patient presents with   Gynecologic Exam   Urinary Tract Infection    Urinary frequency and abnormal odor, back pain on right side, no burning currently x 1 month   *Pt arrived past appointment time*  History of Present Illness: Patient is a 37 y.o. G3P0 presents for annual exam. The patient has multiple complaints today:  UTI symptoms: had bladder surgery 6 or 7 years ago, since than has gotten frequent UTI's, was on a daily prophylactic but stopped d/t changes insurance. Has frequency, odor to her urine and low right sided back pain. Has not seen an Urologist since her surgery. Her last UTI was 1 month ago. She knows bactrim does not work for her.   Chronic BV: does not have symptoms today. Has been using Boric Acid currently, has questions about chronic BV and how to avoid getting it.    Mood: has random outbursts. Has been on Wellbutrin, does not seem to be working as well now. Is not interested in therapy. She tried it a long time ago and did not have a good experience -Stress level is high, she works 2 jobs, one of which is over night, sometimes to leaves one job to go to the next.   Menopausal symptoms:had a complete hysterectomy 11 years ago. Has bene experiencing night sweats. Is on estrace and prometrium. In the past she would have hormone levels drawn and then her medication is adjusted. Having trouble losing belly weight, is a normal weight but would like to lose the extra weight on her bell and neck. Her memory is not as good as it used to be. She forgets words.  LMP: No LMP recorded. Patient has had a hysterectomy.   The patient is sexually active with 1 female partner. She currently uses status post hysterectomy for contraception. She occasionally has dyspareunia.  The patient does perform self breast exams.  There is notable family history of breast or ovarian cancer in her family.  Her 2 sisters have both been diagnosed with some form of breast cancer, she is not sure if they had genetic testing. Maria Silva has had a mammogram.   The patient wears seatbelts: yes.   The patient has regular exercise:  is active at work  .    The patient denies current symptoms of depression.  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  HENT:         Hearing changes   Eyes:        Change in vision   Respiratory:  Positive for shortness of breath.   Cardiovascular:  Positive for chest pain.  Gastrointestinal: Negative.   Genitourinary:  Positive for frequency.  Musculoskeletal: Negative.   Skin: Negative.   Neurological:  Positive for headaches.  Endo/Heme/Allergies:        Hot flashes Hot/cold intolerance   Psychiatric/Behavioral: Negative.    Nipple discharge and pain   Past Medical History:  Patient Active Problem List   Diagnosis Date Noted   Vitamin D deficiency 10/12/2017   Elevated  cholesterol 10/12/2017    Past Surgical History:  Past Surgical History:  Procedure Laterality Date   ABDOMINAL HYSTERECTOMY     APPENDECTOMY     BLADDER REPAIR     BLADDER REPAIR W/ CESAREAN SECTION     bladder stent     Laparascopic     MINOR HEMORRHOIDECTOMY     TUMOR REMOVAL      Gynecologic History:  No LMP recorded. Patient has had a hysterectomy. Contraception: status post hysterectomy Last Pap: Results were: no abnormalities   Obstetric History: G3P0  Family History:  Family History  Adopted: Yes  Problem Relation Age of Onset   Heart disease Maternal Grandfather    Renal Disease Brother    Wilson's disease Son     Social History:   Social History   Socioeconomic History   Marital status: Divorced    Spouse name: Not on file   Number of children: Not on file   Years of education: Not on file   Highest education level: Not on file  Occupational History   Not on file  Tobacco Use   Smoking status: Former   Smokeless tobacco: Never  Building services engineer Use: Never used  Substance and Sexual Activity   Alcohol use: Not Currently    Comment: occass   Drug use: No   Sexual activity: Yes    Birth control/protection: Surgical, Condom    Comment: Hysterectomy  Other Topics Concern   Not on file  Social History Narrative   Not on file   Social Determinants of Health   Financial Resource Strain: Not on file  Food Insecurity: Not on file  Transportation Needs: Not on file  Physical Activity: Not on file  Stress: Not on file  Social Connections: Not on file  Intimate Partner Violence: Not on file    Allergies:  Allergies  Allergen Reactions   Amoxicillin Nausea And Vomiting and Rash   Tape Rash   Tapentadol Rash   Vicodin [Hydrocodone-Acetaminophen] Nausea And Vomiting and Rash    Medications: Prior to Admission medications   Medication Sig Start Date End Date Taking? Authorizing Provider  albuterol (VENTOLIN HFA) 108 (90 Base) MCG/ACT inhaler Inhale 2 puffs into the lungs every 6 (six) hours as needed for wheezing or shortness of breath. 10/30/20  Yes Hildred Laser, MD  budesonide-formoterol (SYMBICORT) 160-4.5 MCG/ACT inhaler Inhale 2 puffs into the lungs 2 (two) times daily. 10/30/20  Yes Hildred Laser, MD  buPROPion (WELLBUTRIN XL) 150 MG 24 hr tablet Take 1 tablet (150 mg total) by mouth daily. 01/08/22  Yes Hildred Laser, MD  cetirizine (ZYRTEC) 10 MG tablet Take by mouth.   Yes [provider]  estradiol (ESTRACE) 2 MG tablet Take 1 tablet (2 mg total) by mouth daily. 10/01/22  Yes Mirna Mires, CNM  ondansetron (ZOFRAN-ODT) 4 MG disintegrating tablet Take 1 tablet (4 mg total) by mouth  every 8 (eight) hours as needed for nausea or vomiting. 11/03/21  Yes Burnette, Alessandra Bevels, PA-C  progesterone (PROMETRIUM) 200 MG capsule TAKE 1 CAPSULE BY MOUTH EVERY DAY 01/08/22  Yes Hildred Laser, MD  topiramate (TOPAMAX) 100 MG tablet TAKE 1 AND 1/2 TABLETS BY MOUTH ONCE DAILY 10/07/22  Yes Hildred Laser, MD  topiramate (TOPAMAX) 50 MG tablet    Yes [provider]  traMADol (ULTRAM) 50 MG tablet Take 1 tablet(s) EVERY 6 HOURS by oral route PRN pain   Yes [provider]  potassium chloride (K-DUR) 10 MEQ tablet Take  1 tablet (10 mEq total) by mouth daily. 08/12/18 06/12/20  Shambley, Melody N, CNM  promethazine (PHENERGAN) 25 MG tablet Take 1 tablet (25 mg total) by mouth every 6 (six) hours as needed for nausea or vomiting. 05/09/18 06/12/20  Payton Mccallum, MD    Physical Exam Vitals: Blood pressure 98/76, pulse 71, height 5\' 4"  (1.626 m), weight 114 lb (51.7 kg).  General: NAD HEENT: normocephalic, anicteric Thyroid: no enlargement, no palpable nodules Pulmonary: No increased work of breathing, CTAB Cardiovascular: RRR, distal pulses 2+ Breast: Breast symmetrical, no tenderness, no palpable nodules or masses, no skin or nipple retraction present, no nipple discharge.  No axillary or supraclavicular lymphadenopathy. Abdomen: NABS, soft, non-tender, non-distended.  Umbilicus without lesions.  No hepatomegaly, splenomegaly or masses palpable. No evidence of hernia  Genitourinary:  External: Normal external female genitalia.  Normal urethral meatus, normal Bartholin's and Skene's glands.    Vagina: Normal vaginal mucosa, no evidence of prolapse. Green-white homogenous discharge present   Cervix: surgically absent  Uterus: surgically absent  Adnexa: surgically absent   Rectal: deferred  Lymphatic: no evidence of inguinal lymphadenopathy Extremities: no edema, erythema, or tenderness Neurologic: Grossly intact Psychiatric: mood appropriate, affect full  Assessment: 37 y.o.  G3P0 routine annual exam  Plan: Problem List Items Addressed This Visit   None Visit Diagnoses     Frequent UTI    -  Primary   Relevant Orders   Ambulatory referral to Urology   UTI symptoms       Relevant Orders   POCT Urinalysis Dipstick (Completed)   Urine Culture   Well woman exam       Relevant Orders   HEP, RPR, HIV Panel (Completed)   Estradiol (Completed)   VITAMIN D 25 Hydroxy (Vit-D Deficiency, Fractures) (Completed)   Cytology - PAP   Screening examination for venereal disease       Relevant Orders   HEP, RPR, HIV Panel (Completed)   Night sweats       Relevant Orders   Estradiol (Completed)   Cervical cancer screening       Relevant Orders   Cytology - PAP       2) STI screening  wasoffered and accepted  2)  ASCCP guidelines and rational discussed.  Patient opts for every 3 years screening interval  3) Contraception - the patient is currently using  status post hysterectomy.     4) Routine healthcare maintenance including cholesterol, diabetes screening discussed managed by PCP  5) Reviewed many of her  symptoms could be related to her lack of sleep and working shift jobs. Your TSH was normal in March.  Recommend seeing PCP to address her concerns for changes in memory and mood. Recommend getting outside for exercise and therapy to help mood.  -Chronic BV-reviewed we do not know why some people are prone to chronic BV, continue Boric Acid and add vaginal probiotic. May consider a trial of metrogel twice a week x 6 months.  -Frequent UTI-culture sent, referral to urology made -menopausal symptoms: estradiol sent, consider adjusting medication  Carie Caddy, CNM  [facility] 12/05/2022, 9:57 PM

## 2022-12-05 ENCOUNTER — Encounter: Payer: Self-pay | Admitting: Licensed Practical Nurse

## 2022-12-05 LAB — HEP, RPR, HIV PANEL
HIV Screen 4th Generation wRfx: NONREACTIVE
Hepatitis B Surface Ag: NEGATIVE
RPR Ser Ql: NONREACTIVE

## 2022-12-05 LAB — ESTRADIOL: Estradiol: 69.8 pg/mL

## 2022-12-05 LAB — VITAMIN D 25 HYDROXY (VIT D DEFICIENCY, FRACTURES): Vit D, 25-Hydroxy: 24.8 ng/mL — ABNORMAL LOW (ref 30.0–100.0)

## 2022-12-06 NOTE — Addendum Note (Signed)
Addended by: Carie Caddy on: 12/06/2022 03:56 PM   Modules accepted: Orders

## 2022-12-09 ENCOUNTER — Other Ambulatory Visit: Payer: Self-pay | Admitting: Licensed Practical Nurse

## 2022-12-09 DIAGNOSIS — E559 Vitamin D deficiency, unspecified: Secondary | ICD-10-CM

## 2022-12-09 DIAGNOSIS — N3000 Acute cystitis without hematuria: Secondary | ICD-10-CM

## 2022-12-09 LAB — CYTOLOGY - PAP
Chlamydia: NEGATIVE
Comment: NEGATIVE
Comment: NEGATIVE
Comment: NEGATIVE
Comment: NORMAL
Diagnosis: UNDETERMINED — AB
High risk HPV: NEGATIVE
Neisseria Gonorrhea: NEGATIVE
Trichomonas: NEGATIVE

## 2022-12-09 LAB — URINE CULTURE

## 2022-12-09 MED ORDER — ERGOCALCIFEROL 1.25 MG (50000 UT) PO CAPS
50000.0000 [IU] | ORAL_CAPSULE | ORAL | 1 refills | Status: DC
Start: 2022-12-09 — End: 2024-01-10

## 2022-12-09 MED ORDER — CIPROFLOXACIN HCL 250 MG PO TABS
250.0000 mg | ORAL_TABLET | Freq: Two times a day (BID) | ORAL | 0 refills | Status: AC
Start: 2022-12-09 — End: 2022-12-12

## 2022-12-09 NOTE — Progress Notes (Unsigned)
Pt seen for annual exam on 6/28, reported UTI sxs, also reported experiencing hot flashes wondered if her medications needed to be changed.  -reviewed with Dr Valentino Saxon, she may be switched to a continuous OCP and stop estrace and Prometrium  TC to [employer] do have a UTI caused by e-coli, we can treat with Keflex, but you do have a PCN allergy and some people with a PCN allergy may react to Keflex or we can treat with Cipro, but Cipro may cause a tendon rupture. Maria Silva prefers to try Cipro, she has used it in the past  Reviewed recommendation by Dr Valentino Saxon to switch to OCP, Maria Silva does not want to use an OCP because she is worried about weight gain. She did try YAZ in the past and did have weight gain. She will continue with her current medications.   Reviewed low Vit D, requesting script for Vitamin D  Script Sent for Cipro and Vitamin D Maria Silva, CNM   [facility]  12/09/22  1:07 PM

## 2022-12-22 ENCOUNTER — Ambulatory Visit: Payer: Medicaid Other | Admitting: Obstetrics and Gynecology

## 2022-12-31 ENCOUNTER — Telehealth: Payer: Medicaid Other | Admitting: Nurse Practitioner

## 2022-12-31 ENCOUNTER — Other Ambulatory Visit: Payer: Self-pay | Admitting: Obstetrics

## 2022-12-31 ENCOUNTER — Other Ambulatory Visit: Payer: Self-pay | Admitting: Obstetrics and Gynecology

## 2022-12-31 ENCOUNTER — Telehealth: Payer: Self-pay

## 2022-12-31 DIAGNOSIS — N390 Urinary tract infection, site not specified: Secondary | ICD-10-CM

## 2022-12-31 MED ORDER — ESTRADIOL 2 MG PO TABS: 2.0000 mg | ORAL_TABLET | Freq: Every day | ORAL | 3 refills | Status: DC

## 2022-12-31 NOTE — Telephone Encounter (Signed)
Pt tx'd from [facility]; pt states she needs her estradiol and Welbutrin XL refilled; recently had her annual exam; needs it sent to [facility] on [address] in [place].  Has been out for two days.  Adv I would see what I could do and will call her if I have problems.

## 2022-12-31 NOTE — Telephone Encounter (Signed)
Refill sent to patients pharmacy. 

## 2022-12-31 NOTE — Progress Notes (Signed)
  Maria Silva,  Thank you for submitting an e-visit request. Since UTIs are very frequent for you it is essential you have urine testing done each time to confirm that you are having recurrent UTIs.   If you are you may need to discuss with your primary care provider the need for preventative treatment   Thank you Maria Silva    I feel your condition warrants further evaluation and I recommend that you be seen for a face to face visit.  Please contact your primary care physician practice to be seen. Many offices offer virtual options to be seen via video if you are not comfortable going in person to a medical facility at this time.  NOTE: You will NOT be charged for this eVisit.  If you do not have a PCP, [facility] offers a free physician referral service available at [phone]. Our trained staff has the experience, knowledge and resources to put you in touch with a physician who is right for you.    If you are having a true medical emergency please call 911.   Your e-visit answers were reviewed by a board certified advanced clinical practitioner to complete your personal care plan.  Thank you for using e-Visits.

## 2023-01-01 NOTE — Telephone Encounter (Signed)
Mailbox is full.

## 2023-01-04 NOTE — Telephone Encounter (Signed)
MyChart msg sent.

## 2023-02-16 ENCOUNTER — Telehealth: Payer: Medicaid Other | Admitting: Physician Assistant

## 2023-02-16 DIAGNOSIS — R3989 Other symptoms and signs involving the genitourinary system: Secondary | ICD-10-CM | POA: Diagnosis not present

## 2023-02-16 MED ORDER — SULFAMETHOXAZOLE-TRIMETHOPRIM 800-160 MG PO TABS: 1.0000 | ORAL_TABLET | Freq: Two times a day (BID) | ORAL | 0 refills | Status: AC

## 2023-02-16 NOTE — Progress Notes (Signed)
I have spent 5 minutes in review of e-visit questionnaire, review and updating patient chart, medical decision making and response to patient.   William Cody Martin, PA-C    

## 2023-02-16 NOTE — Progress Notes (Signed)
E-Visit for Urinary Problems  We are sorry that you are not feeling well.  Here is how we plan to help!  Based on what you shared with me it looks like you most likely have a simple urinary tract infection.  A UTI (Urinary Tract Infection) is a bacterial infection of the bladder.  Most cases of urinary tract infections are simple to treat but a key part of your care is to encourage you to drink plenty of fluids and watch your symptoms carefully.  I have prescribed Bactrim DS One tablet twice a day for 5 days.  Your symptoms should gradually improve. Call us if the burning in your urine worsens, you develop worsening fever, back pain or pelvic pain or if your symptoms do not resolve after completing the antibiotic.  Urinary tract infections can be prevented by drinking plenty of water to keep your body hydrated.  Also be sure when you wipe, wipe from front to back and don't hold it in!  If possible, empty your bladder every 4 hours.  HOME CARE Drink plenty of fluids Compete the full course of the antibiotics even if the symptoms resolve Remember, when you need to go.go. Holding in your urine can increase the likelihood of getting a UTI! GET HELP RIGHT AWAY IF: You cannot urinate You get a high fever Worsening back pain occurs You see blood in your urine You feel sick to your stomach or throw up You feel like you are going to pass out  MAKE SURE YOU  Understand these instructions. Will watch your condition. Will get help right away if you are not doing well or get worse.   Thank you for choosing an e-visit.  Your e-visit answers were reviewed by a board certified advanced clinical practitioner to complete your personal care plan. Depending upon the condition, your plan could have included both over the counter or prescription medications.  Please review your pharmacy choice. Make sure the pharmacy is open so you can pick up prescription now. If there is a problem, you may contact  your provider through MyChart messaging and have the prescription routed to another pharmacy.  Your safety is important to us. If you have drug allergies check your prescription carefully.   For the next 24 hours you can use MyChart to ask questions about today's visit, request a non-urgent call back, or ask for a work or school excuse. You will get an email in the next two days asking about your experience. I hope that your e-visit has been valuable and will speed your recovery.  

## 2023-03-08 ENCOUNTER — Telehealth: Payer: Medicaid Other | Admitting: Family Medicine

## 2023-03-08 DIAGNOSIS — N390 Urinary tract infection, site not specified: Secondary | ICD-10-CM

## 2023-03-08 NOTE — Progress Notes (Signed)
[place]   Recent UTI treatment this month, will need in person for sample and follow up for recurrent UTI     Patient acknowledged agreement and understanding of the plan.

## 2023-04-12 ENCOUNTER — Telehealth: Payer: Medicaid Other | Admitting: Physician Assistant

## 2023-04-12 DIAGNOSIS — R3989 Other symptoms and signs involving the genitourinary system: Secondary | ICD-10-CM | POA: Diagnosis not present

## 2023-04-13 MED ORDER — SULFAMETHOXAZOLE-TRIMETHOPRIM 800-160 MG PO TABS
1.0000 | ORAL_TABLET | Freq: Two times a day (BID) | ORAL | 0 refills | Status: AC
Start: 2023-04-13 — End: ?

## 2023-04-13 NOTE — Progress Notes (Signed)
E-Visit for Urinary Problems  We are sorry that you are not feeling well.  Here is how we plan to help!  Based on what you shared with me it looks like you most likely have a simple urinary tract infection.  A UTI (Urinary Tract Infection) is a bacterial infection of the bladder.  Most cases of urinary tract infections are simple to treat but a key part of your care is to encourage you to drink plenty of fluids and watch your symptoms carefully.  I have prescribed Bactrim DS One tablet twice a day for 5 days.  Your symptoms should gradually improve. Call us if the burning in your urine worsens, you develop worsening fever, back pain or pelvic pain or if your symptoms do not resolve after completing the antibiotic.  Urinary tract infections can be prevented by drinking plenty of water to keep your body hydrated.  Also be sure when you wipe, wipe from front to back and don't hold it in!  If possible, empty your bladder every 4 hours.  HOME CARE Drink plenty of fluids Compete the full course of the antibiotics even if the symptoms resolve Remember, when you need to go.go. Holding in your urine can increase the likelihood of getting a UTI! GET HELP RIGHT AWAY IF: You cannot urinate You get a high fever Worsening back pain occurs You see blood in your urine You feel sick to your stomach or throw up You feel like you are going to pass out  MAKE SURE YOU  Understand these instructions. Will watch your condition. Will get help right away if you are not doing well or get worse.   Thank you for choosing an e-visit.  Your e-visit answers were reviewed by a board certified advanced clinical practitioner to complete your personal care plan. Depending upon the condition, your plan could have included both over the counter or prescription medications.  Please review your pharmacy choice. Make sure the pharmacy is open so you can pick up prescription now. If there is a problem, you may contact  your provider through MyChart messaging and have the prescription routed to another pharmacy.  Your safety is important to us. If you have drug allergies check your prescription carefully.   For the next 24 hours you can use MyChart to ask questions about today's visit, request a non-urgent call back, or ask for a work or school excuse. You will get an email in the next two days asking about your experience. I hope that your e-visit has been valuable and will speed your recovery.  I have spent 5 minutes in review of e-visit questionnaire, review and updating patient chart, medical decision making and response to patient.   Jennifer M Burnette, PA-C  

## 2023-04-15 MED ORDER — FLUCONAZOLE 150 MG PO TABS: ORAL_TABLET | ORAL | 0 refills | Status: AC

## 2023-04-15 NOTE — Addendum Note (Signed)
Addended by: Waldon Merl on: 04/15/2023 10:50 AM   Modules accepted: Orders

## 2023-05-18 ENCOUNTER — Other Ambulatory Visit: Payer: Self-pay

## 2023-05-24 ENCOUNTER — Telehealth: Payer: Self-pay

## 2023-05-24 NOTE — Telephone Encounter (Signed)
I called Maria Silva to make sure we have the correct pharmacy but the voicemail box is full

## 2023-05-24 NOTE — Telephone Encounter (Signed)
Maria Silva called triage line stating the nurse called her about her birth control renewal and she states it was not renewed she's at the pharmacy now trying to get her medication.

## 2023-05-24 NOTE — Telephone Encounter (Signed)
Maria Course do you approve to send in progesterone? You last saw her? She states she's been taking it daily and she's out.

## 2023-05-25 ENCOUNTER — Other Ambulatory Visit: Payer: Self-pay

## 2023-05-25 DIAGNOSIS — Z01419 Encounter for gynecological examination (general) (routine) without abnormal findings: Secondary | ICD-10-CM

## 2023-05-25 MED ORDER — PROGESTERONE 200 MG PO CAPS: 200.0000 mg | ORAL_CAPSULE | Freq: Every day | ORAL | 3 refills | Status: DC

## 2023-05-30 ENCOUNTER — Telehealth: Payer: Medicaid Other | Admitting: Family Medicine

## 2023-05-30 DIAGNOSIS — N3000 Acute cystitis without hematuria: Secondary | ICD-10-CM

## 2023-05-30 MED ORDER — NITROFURANTOIN MONOHYD MACRO 100 MG PO CAPS: 100.0000 mg | ORAL_CAPSULE | Freq: Two times a day (BID) | ORAL | 0 refills | Status: AC

## 2023-05-30 NOTE — Progress Notes (Signed)
E-Visit for Urinary Problems  We are sorry that you are not feeling well.  Here is how we plan to help!  Based on what you shared with me it looks like you most likely have a simple urinary tract infection.  A UTI (Urinary Tract Infection) is a bacterial infection of the bladder.  Most cases of urinary tract infections are simple to treat but a key part of your care is to encourage you to drink plenty of fluids and watch your symptoms carefully.  I have prescribed MacroBid 100 mg twice a day for 5 days.  Your symptoms should gradually improve. Call us if the burning in your urine worsens, you develop worsening fever, back pain or pelvic pain or if your symptoms do not resolve after completing the antibiotic.  Urinary tract infections can be prevented by drinking plenty of water to keep your body hydrated.  Also be sure when you wipe, wipe from front to back and don't hold it in!  If possible, empty your bladder every 4 hours.  HOME CARE Drink plenty of fluids Compete the full course of the antibiotics even if the symptoms resolve Remember, when you need to go.go. Holding in your urine can increase the likelihood of getting a UTI! GET HELP RIGHT AWAY IF: You cannot urinate You get a high fever Worsening back pain occurs You see blood in your urine You feel sick to your stomach or throw up You feel like you are going to pass out  MAKE SURE YOU  Understand these instructions. Will watch your condition. Will get help right away if you are not doing well or get worse.   Thank you for choosing an e-visit.  Your e-visit answers were reviewed by a board certified advanced clinical practitioner to complete your personal care plan. Depending upon the condition, your plan could have included both over the counter or prescription medications.  Please review your pharmacy choice. Make sure the pharmacy is open so you can pick up prescription now. If there is a problem, you may contact your  provider through MyChart messaging and have the prescription routed to another pharmacy.  Your safety is important to us. If you have drug allergies check your prescription carefully.   For the next 24 hours you can use MyChart to ask questions about today's visit, request a non-urgent call back, or ask for a work or school excuse. You will get an email in the next two days asking about your experience. I hope that your e-visit has been valuable and will speed your recovery.    have provided 5 minutes of non face to face time during this encounter for chart review and documentation.   

## 2023-06-24 ENCOUNTER — Telehealth: Payer: Medicaid Other | Admitting: Physician Assistant

## 2023-06-24 DIAGNOSIS — N390 Urinary tract infection, site not specified: Secondary | ICD-10-CM

## 2023-06-24 NOTE — Progress Notes (Signed)
  Because you have had recurrent UTIs, most recently treated in December 2024, I feel your condition warrants further evaluation and I recommend that you be seen in a face-to-face visit. When you have recurrent UTIs and frequent antibiotics, you can develop resistant bacteria, and it is recommended to have periodic urine cultures to evaluate for this.   NOTE: There will be NO CHARGE for this E-Visit   If you are having a true medical emergency, please call 911.     For an urgent face to face visit, [facility] has multiple urgent care centers for your convenience.  Click the link below for the full list of locations and hours, walk-in wait times, appointment scheduling options and driving directions:  Urgent Care - [place], [place], [place], [place], [place], [place]  [place]     Your MyChart E-visit questionnaire answers were reviewed by a board certified advanced clinical practitioner to complete your personal care plan based on your specific symptoms.    Thank you for using e-Visits.       I have spent 5 minutes in review of e-visit questionnaire, review and updating patient chart, medical decision making and response to patient.   Margaretann Loveless, PA-C

## 2023-06-24 NOTE — Progress Notes (Signed)
  Because of recurring urinary symptoms with treatment for UTI in the past month, I feel your condition warrants further evaluation and I recommend that you be seen in a face-to-face visit.   NOTE: There will be NO CHARGE for this E-Visit   If you are having a true medical emergency, please call 911.     For an urgent face to face visit, [facility] has multiple urgent care centers for your convenience.  Click the link below for the full list of locations and hours, walk-in wait times, appointment scheduling options and driving directions:  Urgent Care - [place], [place], [place], [place], [place], [place]  [place]     Your MyChart E-visit questionnaire answers were reviewed by a board certified advanced clinical practitioner to complete your personal care plan based on your specific symptoms.    Thank you for using e-Visits.

## 2023-07-17 ENCOUNTER — Telehealth: Payer: Medicaid Other | Admitting: Family Medicine

## 2023-07-17 DIAGNOSIS — R399 Unspecified symptoms and signs involving the genitourinary system: Secondary | ICD-10-CM | POA: Diagnosis not present

## 2023-07-17 MED ORDER — NITROFURANTOIN MONOHYD MACRO 100 MG PO CAPS: 100.0000 mg | ORAL_CAPSULE | Freq: Two times a day (BID) | ORAL | 0 refills | Status: AC

## 2023-07-17 NOTE — Progress Notes (Signed)

## 2023-08-24 ENCOUNTER — Other Ambulatory Visit: Payer: Self-pay | Admitting: Licensed Practical Nurse

## 2023-08-24 DIAGNOSIS — Z01419 Encounter for gynecological examination (general) (routine) without abnormal findings: Secondary | ICD-10-CM

## 2023-10-04 ENCOUNTER — Other Ambulatory Visit: Payer: Self-pay | Admitting: Obstetrics and Gynecology

## 2023-10-09 ENCOUNTER — Encounter: Admitting: Nurse Practitioner

## 2023-10-09 DIAGNOSIS — N390 Urinary tract infection, site not specified: Secondary | ICD-10-CM

## 2023-10-09 NOTE — Progress Notes (Signed)
 Because you are currently prescribed nitrofurantoin  50 mg nightly and are having symptoms of UTI you will need to have a urine culture and sensitivity, I feel your condition warrants further evaluation and I recommend that you be seen for a face to face visit.  Please contact your primary care physician practice to be seen. Many offices offer virtual options to be seen via video if you are not comfortable going in person to a medical facility at this time.  NOTE: You will NOT be charged for this eVisit.  If you do not have a PCP, [facility] offers a free physician referral service available at [phone]. Our trained staff has the experience, knowledge and resources to put you in touch with a physician who is right for you.    If you are having a true medical emergency please call 911.   Your e-visit answers were reviewed by a board certified advanced clinical practitioner to complete your personal care plan.  Thank you for using e-Visits.

## 2023-10-09 NOTE — Progress Notes (Signed)
 I have spent 5 minutes in review of e-visit questionnaire, review and updating patient chart, medical decision making and response to patient.   Claiborne Rigg, NP

## 2023-10-18 ENCOUNTER — Other Ambulatory Visit: Payer: Self-pay

## 2023-10-18 DIAGNOSIS — R52 Pain, unspecified: Secondary | ICD-10-CM

## 2023-10-18 MED ORDER — TOPIRAMATE 100 MG PO TABS: ORAL_TABLET | ORAL | 0 refills | Status: DC

## 2023-12-15 ENCOUNTER — Other Ambulatory Visit: Payer: Self-pay | Admitting: Licensed Practical Nurse

## 2023-12-15 DIAGNOSIS — E559 Vitamin D deficiency, unspecified: Secondary | ICD-10-CM

## 2024-01-07 ENCOUNTER — Encounter: Payer: Self-pay | Admitting: Obstetrics and Gynecology

## 2024-01-10 ENCOUNTER — Other Ambulatory Visit: Payer: Self-pay

## 2024-01-10 DIAGNOSIS — R52 Pain, unspecified: Secondary | ICD-10-CM

## 2024-01-10 DIAGNOSIS — E559 Vitamin D deficiency, unspecified: Secondary | ICD-10-CM

## 2024-01-10 MED ORDER — ESTRADIOL 2 MG PO TABS: 2.0000 mg | ORAL_TABLET | Freq: Every day | ORAL | 0 refills | Status: DC

## 2024-01-10 MED ORDER — ERGOCALCIFEROL 1.25 MG (50000 UT) PO CAPS: 50000.0000 [IU] | ORAL_CAPSULE | ORAL | 0 refills | Status: DC

## 2024-01-10 MED ORDER — BUPROPION HCL ER (XL) 150 MG PO TB24
150.0000 mg | ORAL_TABLET | Freq: Every day | ORAL | 0 refills | Status: DC
Start: 2024-01-10 — End: 2024-02-29

## 2024-01-10 MED ORDER — BUPROPION HCL ER (XL) 300 MG PO TB24: 300.0000 mg | ORAL_TABLET | Freq: Every day | ORAL | 0 refills | Status: DC

## 2024-01-10 MED ORDER — TOPIRAMATE 100 MG PO TABS: ORAL_TABLET | ORAL | 0 refills | Status: DC

## 2024-02-02 ENCOUNTER — Other Ambulatory Visit: Payer: Self-pay | Admitting: Obstetrics

## 2024-02-02 DIAGNOSIS — E559 Vitamin D deficiency, unspecified: Secondary | ICD-10-CM

## 2024-02-04 ENCOUNTER — Other Ambulatory Visit: Payer: Self-pay | Admitting: Obstetrics

## 2024-02-08 ENCOUNTER — Ambulatory Visit: Admitting: Licensed Practical Nurse

## 2024-02-18 ENCOUNTER — Other Ambulatory Visit: Payer: Self-pay | Admitting: Licensed Practical Nurse

## 2024-02-18 ENCOUNTER — Other Ambulatory Visit: Payer: Self-pay | Admitting: Obstetrics

## 2024-02-18 DIAGNOSIS — R52 Pain, unspecified: Secondary | ICD-10-CM

## 2024-02-29 ENCOUNTER — Ambulatory Visit (INDEPENDENT_AMBULATORY_CARE_PROVIDER_SITE_OTHER): Admitting: Licensed Practical Nurse

## 2024-02-29 ENCOUNTER — Encounter: Payer: Self-pay | Admitting: Licensed Practical Nurse

## 2024-02-29 VITALS — BP 124/84 | HR 87 | Ht 64.5 in | Wt 126.1 lb

## 2024-02-29 DIAGNOSIS — G43711 Chronic migraine without aura, intractable, with status migrainosus: Secondary | ICD-10-CM | POA: Diagnosis not present

## 2024-02-29 DIAGNOSIS — F419 Anxiety disorder, unspecified: Secondary | ICD-10-CM | POA: Diagnosis not present

## 2024-02-29 DIAGNOSIS — R52 Pain, unspecified: Secondary | ICD-10-CM | POA: Diagnosis not present

## 2024-02-29 DIAGNOSIS — E8941 Symptomatic postprocedural ovarian failure: Secondary | ICD-10-CM

## 2024-02-29 DIAGNOSIS — R5383 Other fatigue: Secondary | ICD-10-CM | POA: Diagnosis not present

## 2024-02-29 DIAGNOSIS — Z01419 Encounter for gynecological examination (general) (routine) without abnormal findings: Secondary | ICD-10-CM

## 2024-02-29 DIAGNOSIS — Z780 Asymptomatic menopausal state: Secondary | ICD-10-CM

## 2024-02-29 MED ORDER — TOPIRAMATE 100 MG PO TABS: ORAL_TABLET | ORAL | 0 refills | Status: DC

## 2024-02-29 MED ORDER — ESTRADIOL 2 MG PO TABS: 2.0000 mg | ORAL_TABLET | Freq: Every day | ORAL | 11 refills | Status: DC

## 2024-02-29 MED ORDER — VENLAFAXINE HCL ER 37.5 MG PO CP24: 37.5000 mg | ORAL_CAPSULE | Freq: Every day | ORAL | 1 refills | Status: DC

## 2024-02-29 MED ORDER — BUPROPION HCL ER (XL) 300 MG PO TB24: 300.0000 mg | ORAL_TABLET | Freq: Every day | ORAL | 11 refills | Status: DC

## 2024-02-29 MED ORDER — BUPROPION HCL ER (XL) 150 MG PO TB24: 150.0000 mg | ORAL_TABLET | Freq: Every day | ORAL | 11 refills | Status: DC

## 2024-02-29 MED ORDER — MAGNESIUM COMPLEX 300 MG PO TABS: 1.0000 | ORAL_TABLET | Freq: Every day | ORAL | 3 refills | Status: AC

## 2024-03-01 ENCOUNTER — Other Ambulatory Visit: Payer: Self-pay | Admitting: Obstetrics

## 2024-03-01 DIAGNOSIS — E559 Vitamin D deficiency, unspecified: Secondary | ICD-10-CM

## 2024-03-02 NOTE — Telephone Encounter (Signed)
 Pt has appt 03/08/24

## 2024-03-03 ENCOUNTER — Ambulatory Visit: Payer: Self-pay | Admitting: Licensed Practical Nurse

## 2024-03-03 LAB — COMPREHENSIVE METABOLIC PANEL WITH GFR
ALT: 29 IU/L (ref 0–32)
AST: 26 IU/L (ref 0–40)
Albumin: 4.6 g/dL (ref 3.9–4.9)
Alkaline Phosphatase: 53 IU/L (ref 41–116)
BUN/Creatinine Ratio: 21 (ref 9–23)
BUN: 16 mg/dL (ref 6–20)
Bilirubin Total: 0.3 mg/dL (ref 0.0–1.2)
CO2: 21 mmol/L (ref 20–29)
Calcium: 9.9 mg/dL (ref 8.7–10.2)
Chloride: 104 mmol/L (ref 96–106)
Creatinine, Ser: 0.75 mg/dL (ref 0.57–1.00)
Globulin, Total: 2.9 g/dL (ref 1.5–4.5)
Glucose: 79 mg/dL (ref 70–99)
Potassium: 4.7 mmol/L (ref 3.5–5.2)
Sodium: 140 mmol/L (ref 134–144)
Total Protein: 7.5 g/dL (ref 6.0–8.5)
eGFR: 106 mL/min/1.73 (ref >59)

## 2024-03-03 LAB — CBC WITH DIFFERENTIAL/PLATELET
Basophils Absolute: 0.1 x10E3/uL (ref 0.0–0.2)
Basos: 1 %
EOS (ABSOLUTE): 0.1 x10E3/uL (ref 0.0–0.4)
Eos: 2 %
Hematocrit: 43.5 % (ref 34.0–46.6)
Hemoglobin: 14.3 g/dL (ref 11.1–15.9)
Immature Grans (Abs): 0 x10E3/uL (ref 0.0–0.1)
Immature Granulocytes: 0 %
Lymphocytes Absolute: 1.4 x10E3/uL (ref 0.7–3.1)
Lymphs: 32 %
MCH: 32.2 pg (ref 26.6–33.0)
MCHC: 32.9 g/dL (ref 31.5–35.7)
MCV: 98 fL — ABNORMAL HIGH (ref 79–97)
Monocytes Absolute: 0.3 x10E3/uL (ref 0.1–0.9)
Monocytes: 7 %
Neutrophils Absolute: 2.6 x10E3/uL (ref 1.4–7.0)
Neutrophils: 58 %
Platelets: 257 x10E3/uL (ref 150–450)
RBC: 4.44 x10E6/uL (ref 3.77–5.28)
RDW: 11.8 % (ref 11.7–15.4)
WBC: 4.5 x10E3/uL (ref 3.4–10.8)

## 2024-03-03 LAB — FOLATE: Folate: 13.3 ng/mL (ref >3.0)

## 2024-03-03 LAB — TSH+FREE T4
Free T4: 1.3 ng/dL (ref 0.82–1.77)
TSH: 1.56 u[IU]/mL (ref 0.450–4.500)

## 2024-03-03 LAB — VITAMIN B1: Thiamine: 126.5 nmol/L (ref 66.5–200.0)

## 2024-03-03 LAB — VITAMIN D 25 HYDROXY (VIT D DEFICIENCY, FRACTURES): Vit D, 25-Hydroxy: 41.5 ng/mL (ref 30.0–100.0)

## 2024-03-05 ENCOUNTER — Encounter: Payer: Self-pay | Admitting: Licensed Practical Nurse

## 2024-03-05 MED ORDER — PROGESTERONE 200 MG PO CAPS: 200.0000 mg | ORAL_CAPSULE | Freq: Every day | ORAL | 2 refills | Status: AC

## 2024-03-05 NOTE — Progress Notes (Signed)
 Patient, No Pcp Per   No chief complaint on file.   HPI:      Maria Silva is a 38 y.o. G3P0 whose LMP was No LMP recorded. Patient has had a hysterectomy., presents today for multiple concerns: She was unable to keep follow up appointments which meant she has not been able to get her medications renewed so has not taken her medications in about a month.   Headaches -has had pain on her Left side daily, sometimes it is on both sides. She believes she gets migraines and her eyes hurt when the pain comes on. These headaches seem to be getting worse overtime.  Caffeine sometimes help. Benadryl  and BC powder helps. She used to use Topamax  with good relief. She hs not seen a neurologist in a long time. She would like to try a Magnesium  complex but believes she needs a prescription.   Anxiety/Depression  -has been off of her mediatation for 1 month. Feels her mood is getting worse. The anxiety is worse at night. She worries a lot. Her mood shifts, she can get mean and snappy at times. She cries often. Even her children have noticed the change in her mood.  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  -she is not interested in therapy   Perimenopause  -Night sweats and hotflashes, was using estrogen  and progesterone  which helps, her symptoms are more mild, she ran out of estrogen, wonders if she needs to increase her dose or add another medication.  -does not feel like  herself  -feels her memory is shot, she can forget what she is doing while do the activity,  -acne  -feel shot all of the time  -feels exhausted without doing anything  -haws not been able to lose weight in her abdominal area despite trying   Knot in Right side  -this has been there for sometime, she has  cramping  on this side, she was evaluated  by 2 physicians and this CNM, she had a normal pelvic US . Was instructed to see her PCP who told her it was a GYN concern.   Patient Active Problem List   Diagnosis Date Noted   Vitamin D  deficiency 10/12/2017   Elevated cholesterol 10/12/2017    Past Surgical History:  Procedure Laterality Date   ABDOMINAL HYSTERECTOMY     APPENDECTOMY     BLADDER REPAIR     BLADDER REPAIR W/ CESAREAN SECTION     bladder stent     Laparascopic     MINOR HEMORRHOIDECTOMY     TUMOR REMOVAL      Family History  Adopted: Yes  Problem Relation Age of Onset   Heart disease Maternal Grandfather    Renal Disease Brother    Wilson's disease Son     Social History   Socioeconomic History   Marital status: Divorced    Spouse name: Not on file   Number of children: Not on file   Years of education: Not on file   Highest education level: Not on file  Occupational History   Not on file  Tobacco Use   Smoking status: Former   Smokeless tobacco: Never  Vaping Use   Vaping status: Never Used  Substance and Sexual Activity   Alcohol use:  Not Currently    Comment: occass   Drug use: No   Sexual activity: Yes    Birth control/protection: Surgical, Condom    Comment: Hysterectomy  Other Topics Concern   Not on file  Social History Narrative   Not on file   Social Drivers of Health   Financial Resource Strain: Not on file  Food Insecurity: Not on file  Transportation Needs: Not on file  Physical Activity: Not on file  Stress: Not on file  Social Connections: Not on file  Intimate Partner Violence: Not on file    Outpatient Medications Prior to Visit  Medication Sig Dispense Refill   albuterol  (VENTOLIN  HFA) 108 (90 Base) MCG/ACT inhaler Inhale 2 puffs into the lungs every 6 (six) hours as needed for wheezing or shortness of breath. 1 each 4   budesonide -formoterol  (SYMBICORT ) 160-4.5 MCG/ACT inhaler Inhale 2 puffs into the lungs 2 (two) times daily. 1 each 6   cetirizine (ZYRTEC) 10  MG tablet Take by mouth.     fluconazole  (DIFLUCAN ) 150 MG tablet Take 1 tablet PO once. Repeat in 3 days if needed. 2 tablet 0   ondansetron  (ZOFRAN -ODT) 4 MG disintegrating tablet Take 1 tablet (4 mg total) by mouth every 8 (eight) hours as needed for nausea or vomiting. 20 tablet 0   sulfamethoxazole -trimethoprim  (BACTRIM  DS) 800-160 MG tablet Take 1 tablet by mouth 2 (two) times daily. 10 tablet 0   buPROPion  (WELLBUTRIN  XL) 150 MG 24 hr tablet Take 1 tablet (150 mg total) by mouth daily. 30 tablet 0   buPROPion  (WELLBUTRIN  XL) 300 MG 24 hr tablet Take 1 tablet (300 mg total) by mouth daily. 30 tablet 0   ergocalciferol  (DRISDOL ) 1.25 MG (50000 UT) capsule Take 1 capsule (50,000 Units total) by mouth once a week. 4 capsule 0   estradiol  (ESTRACE ) 2 MG tablet Take 1 tablet (2 mg total) by mouth daily. 30 tablet 0   progesterone  (PROMETRIUM ) 200 MG capsule TAKE 1 CAPSULE (200 MG TOTAL) BY MOUTH DAILY. TAKE 1 CAPSULE BY MOUTH EVERY DAY 90 capsule 1   topiramate  (TOPAMAX ) 100 MG tablet TAKE 1 AND 1/2 TABLETS BY MOUTH ONCE DAILY 45 tablet 0   traMADol  (ULTRAM ) 50 MG tablet Take 1 tablet(s) EVERY 6 HOURS by oral route PRN pain     No facility-administered medications prior to visit.      ROS:  Review of Systems see HPI    OBJECTIVE:   Vitals:  BP 124/84 (BP Location: Right Arm, Patient Position: Sitting, Cuff Size: Normal)   Pulse 87   Ht 5' 4.5 (1.638 m)   Wt 126 lb 1.6 oz (57.2 kg)   BMI 21.31 kg/m   Physical Exam Constitutional:      Appearance: Normal appearance.  Cardiovascular:     Rate and Rhythm: Normal rate and regular rhythm.     Pulses: Normal pulses.     Heart sounds: Normal heart sounds.  Pulmonary:     Effort: Pulmonary effort is normal.     Breath sounds: Normal breath sounds.  Abdominal:     General: Abdomen is flat.     Tenderness: There is no abdominal tenderness.  Genitourinary:    Comments: 2-3cm palpable firm line in right groin, non tender no  swelling or redness.   Musculoskeletal:        General: Normal range of motion.     Cervical back: Normal range of motion and neck supple.  Skin:    General: Skin is warm.  Neurological:     General: No focal deficit present.     Mental Status: She is alert. Mental status is at baseline.  Psychiatric:        Mood and Affect: Mood normal.        Thought Content: Thought content normal.     Results: No results found for this or any previous visit (from the past 24 hours).   Assessment/Plan: 1. Other fatigue - CBC w/Diff/Platelet - Comprehensive metabolic panel with GFR - TSH + free T4 - Vitamin B1 - Folate - VITAMIN D  25 Hydroxy (Vit-D Deficiency, Fractures)  2. Anxiety - venlafaxine  XR (EFFEXOR  XR) 37.5 MG 24 hr capsule; Take 1 capsule (37.5 mg total) by mouth daily with breakfast.  Dispense: 30 capsule; Refill: 1 - buPROPion  (WELLBUTRIN  XL) 150 MG 24 hr tablet; Take 1 tablet (150 mg total) by mouth daily.  Dispense: 30 tablet; Refill: 11 - buPROPion  (WELLBUTRIN  XL) 300 MG 24 hr tablet; Take 1 tablet (300 mg total) by mouth daily.  Dispense: 30 tablet; Refill: 11  3. 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  Dispense: 30 tablet; Refill: 3 - Ambulatory referral to Neurology - topiramate  (TOPAMAX ) 100 MG tablet; TAKE 1 AND 1/2 TABLETS BY MOUTH ONCE DAILY  Dispense: 45 tablet; Refill: 0  4. Pain - topiramate  (TOPAMAX ) 100 MG tablet; TAKE 1 AND 1/2 TABLETS BY MOUTH ONCE DAILY  Dispense: 45 tablet; Refill: 0  5. Hot flashes due to surgical menopause - estradiol  (ESTRACE ) 2 MG tablet; Take 1 tablet (2 mg total) by mouth daily.  Dispense: 30 tablet; Refill: 11  6. Well woman exam - progesterone  (PROMETRIUM ) 200 MG capsule; Take 1 capsule (200 mg total) by mouth daily. TAKE 1 CAPSULE BY MOUTH EVERY DAY  Dispense: 90 capsule; Refill: 2     Meds ordered this  encounter  Medications   venlafaxine  XR (EFFEXOR  XR) 37.5 MG 24 hr capsule    Sig: Take 1 capsule (37.5 mg total) by mouth daily with breakfast.    Dispense:  30 capsule    Refill:  1   Magnesium  (MAGNESIUM  COMPLEX) 300 MG TABS    Sig: Take 1 tablet by mouth daily.    Dispense:  30 tablet    Refill:  3   topiramate  (TOPAMAX ) 100 MG tablet    Sig: TAKE 1 AND 1/2 TABLETS BY MOUTH ONCE DAILY    Dispense:  45 tablet    Refill:  0   estradiol  (ESTRACE ) 2 MG tablet    Sig: Take 1 tablet (2 mg total) by mouth daily.    Dispense:  30 tablet    Refill:  11   buPROPion  (WELLBUTRIN  XL) 150 MG 24 hr tablet    Sig: Take 1 tablet (150 mg total) by mouth daily.    Dispense:  30 tablet    Refill:  11   buPROPion  (WELLBUTRIN  XL) 300 MG 24 hr tablet    Sig: Take 1 tablet (300 mg total) by mouth daily.    Dispense:  30 tablet    Refill:  11   progesterone  (PROMETRIUM ) 200 MG capsule    Sig: Take 1 capsule (200 mg total) by mouth daily. TAKE 1 CAPSULE BY MOUTH EVERY DAY    Dispense:  90 capsule    Refill:  2    Discussed many of her concerns could be interconnected. If she is not sleeping well her  mood could be off, or her mood could affect sleep. Many of her symptoms could be related to perimenopause, however they cannot all be attributed to perimenopause or GYN concerns. You should be evaluated by your PCP for non gyn concerns that could be happening like thyroid  conditions, chronic fatigue or fibromyalgia. Will start Effexor  to see if this will improve perimenopause symptoms and mood. Strongly encourage therapy for mood. Discussed the New Menopause Book and the [employer], may use the Magnesium  complex-this is OTC. Referral for Neuro made so that you could be evaluated and perhaps started on a medication that can improve the headaches. The knot in your groin is not a GYN concern.   All medications reordered as requested  RTC 3 months to follow up on Effexor    Pt needs annual exam   JINNIE HERO  [facility], CNM 03/05/2024 5:20 PM

## 2024-03-08 ENCOUNTER — Ambulatory Visit: Admitting: Licensed Practical Nurse

## 2024-03-08 NOTE — Progress Notes (Deleted)
 Gynecology Annual Exam   PCP: Patient, No Pcp Per  Chief Complaint: No chief complaint on file.   History of Present Illness: Patient is a 38 y.o. G3P0 presents for annual exam. The patient has no complaints today.   LMP: No LMP recorded. Patient has had a hysterectomy. Average Interval: {Desc; regular/irreg:14544}, {numbers 22-35:14824} days Duration of flow: {numbers; 0-10:33138} days Heavy Menses: {yes/no:63} Clots: {yes/no:63} Intermenstrual Bleeding: {yes/no:63} Postcoital Bleeding: {yes/no:63} Dysmenorrhea: {yes/no:63}  The patient {sys sexually active:13135} sexually active. She currently uses {method:5051} for contraception. She {has/denies:315300} dyspareunia.  The patient {DOES_DOES WNU:81435} perform self breast exams.  There {is/is no:19420} notable family history of breast or ovarian cancer in her family.  The patient wears seatbelts: {yes/no:63}.   The patient has regular exercise: {yes/no/not asked:9010}.    The patient {Blank single:19197::reports,denies} current symptoms of depression.    Review of Systems: ROS  Past Medical History:  Patient Active Problem List   Diagnosis Date Noted   Vitamin D  deficiency 10/12/2017   Elevated cholesterol 10/12/2017    Past Surgical History:  Past Surgical History:  Procedure Laterality Date   ABDOMINAL HYSTERECTOMY     APPENDECTOMY     BLADDER REPAIR     BLADDER REPAIR W/ CESAREAN SECTION     bladder stent     Laparascopic     MINOR HEMORRHOIDECTOMY     TUMOR REMOVAL      Gynecologic History:  No LMP recorded. Patient has had a hysterectomy. Contraception: {method:5051} Last Pap: Results were: {Findings; lab pap smear results:16707::NIL and HR HPV+,NIL and HR HPV negative}   Obstetric History: G3P0  Family History:  Family History  Adopted: Yes  Problem Relation Age of Onset   Heart disease Maternal Grandfather    Renal Disease Brother    Wilson's disease Son     Social History:  Social  History   Socioeconomic History   Marital status: Divorced    Spouse name: Not on file   Number of children: Not on file   Years of education: Not on file   Highest education level: Not on file  Occupational History   Not on file  Tobacco Use   Smoking status: Former   Smokeless tobacco: Never  Vaping Use   Vaping status: Never Used  Substance and Sexual Activity   Alcohol use: Not Currently    Comment: occass   Drug use: No   Sexual activity: Yes    Birth control/protection: Surgical, Condom    Comment: Hysterectomy  Other Topics Concern   Not on file  Social History Narrative   Not on file   Social Drivers of Health   Financial Resource Strain: Not on file  Food Insecurity: Not on file  Transportation Needs: Not on file  Physical Activity: Not on file  Stress: Not on file  Social Connections: Not on file  Intimate Partner Violence: Not on file    Allergies:  Allergies  Allergen Reactions   Amoxicillin Nausea And Vomiting and Rash   Tape Rash   Tapentadol Rash   Vicodin [Hydrocodone-Acetaminophen ] Nausea And Vomiting and Rash    Medications: Prior to Admission medications   Medication Sig Start Date End Date Taking? Authorizing Provider  albuterol  (VENTOLIN  HFA) 108 (90 Base) MCG/ACT inhaler Inhale 2 puffs into the lungs every 6 (six) hours as needed for wheezing or shortness of breath. 10/30/20   Connell Davies, MD  budesonide -formoterol  (SYMBICORT ) 160-4.5 MCG/ACT inhaler Inhale 2 puffs into the lungs 2 (two) times  daily. 10/30/20   Connell Davies, MD  buPROPion  (WELLBUTRIN  XL) 150 MG 24 hr tablet Take 1 tablet (150 mg total) by mouth daily. 02/29/24   Dominic, Jinnie Jansky, CNM  buPROPion  (WELLBUTRIN  XL) 300 MG 24 hr tablet Take 1 tablet (300 mg total) by mouth daily. 02/29/24   Dominic, Jinnie Jansky, CNM  cetirizine (ZYRTEC) 10 MG tablet Take by mouth.    [provider]  estradiol  (ESTRACE ) 2 MG tablet Take 1 tablet (2 mg total) by mouth daily. 02/29/24    Dominic, Jinnie Jansky, CNM  fluconazole  (DIFLUCAN ) 150 MG tablet Take 1 tablet PO once. Repeat in 3 days if needed. 04/15/23   Gladis Elsie BROCKS, PA-C  Magnesium  (MAGNESIUM  COMPLEX) 300 MG TABS Take 1 tablet by mouth daily. 02/29/24   Dominic, Jinnie Jansky, CNM  ondansetron  (ZOFRAN -ODT) 4 MG disintegrating tablet Take 1 tablet (4 mg total) by mouth every 8 (eight) hours as needed for nausea or vomiting. 11/03/21   Vivienne Delon HERO, PA-C  progesterone  (PROMETRIUM ) 200 MG capsule Take 1 capsule (200 mg total) by mouth daily. TAKE 1 CAPSULE BY MOUTH EVERY DAY 03/05/24   Dominic, Jinnie Jansky, CNM  sulfamethoxazole -trimethoprim  (BACTRIM  DS) 800-160 MG tablet Take 1 tablet by mouth 2 (two) times daily. 04/13/23   Vivienne Delon HERO, PA-C  topiramate  (TOPAMAX ) 100 MG tablet TAKE 1 AND 1/2 TABLETS BY MOUTH ONCE DAILY 02/29/24   Dominic, Jinnie Jansky, CNM  venlafaxine  XR (EFFEXOR  XR) 37.5 MG 24 hr capsule Take 1 capsule (37.5 mg total) by mouth daily with breakfast. 02/29/24   Dominic, Jinnie Jansky, CNM  Vitamin D , Ergocalciferol , (DRISDOL ) 1.25 MG (50000 UNIT) CAPS capsule TAKE 1 CAPSULE BY MOUTH ONE TIME PER WEEK 03/02/24   Justino Setter M, CNM  potassium chloride  (K-DUR) 10 MEQ tablet Take 1 tablet (10 mEq total) by mouth daily. 08/12/18 06/12/20  Shambley, Melody N, CNM  promethazine  (PHENERGAN ) 25 MG tablet Take 1 tablet (25 mg total) by mouth every 6 (six) hours as needed for nausea or vomiting. 05/09/18 06/12/20  Servando Hire, MD    Physical Exam Vitals: There were no vitals taken for this visit.  General: NAD HEENT: normocephalic, anicteric Thyroid : no enlargement, no palpable nodules Pulmonary: No increased work of breathing, CTAB Cardiovascular: RRR, distal pulses 2+ Breast: Breast symmetrical, no tenderness, no palpable nodules or masses, no skin or nipple retraction present, no nipple discharge.  No axillary or supraclavicular lymphadenopathy. Abdomen: NABS, soft, non-tender, non-distended.   Umbilicus without lesions.  No hepatomegaly, splenomegaly or masses palpable. No evidence of hernia  Genitourinary:  External: Normal external female genitalia.  Normal urethral meatus, normal Bartholin's and Skene's glands.    Vagina: Normal vaginal mucosa, no evidence of prolapse.    Cervix: Grossly normal in appearance, no bleeding  Uterus: Non-enlarged, mobile, normal contour.  No CMT  Adnexa: ovaries non-enlarged, no adnexal masses  Rectal: deferred  Lymphatic: no evidence of inguinal lymphadenopathy Extremities: no edema, erythema, or tenderness Neurologic: Grossly intact Psychiatric: mood appropriate, affect full  Female chaperone present for pelvic and breast  portions of the physical exam    Assessment: 38 y.o. G3P0 routine annual exam  Plan: Problem List Items Addressed This Visit   None   2) STI screening  {Blank single:19197::was,was not}offered and {Blank single:19197::accepted,declined,therefore not obtained}  2)  ASCCP guidelines and rational discussed.  Patient opts for {Blank single:19197::***,every 5 years,every 3 years,yearly,discontinue age >65,discontinue secondary to prior hysterectomy} screening interval  3) Contraception - the patient is currently using  {  method:5051}.  She is {Blank single:19197::happy with her current form of contraception and plans to continue,interested in changing to ***,interested in starting Contraception: ***,not currently in need of contraception secondary to being sterile,attempting to conceive in the near future}  4) Routine healthcare maintenance including cholesterol, diabetes screening discussed {Blank single:19197::managed by PCP,Ordered today,To return fasting at a later date,Declines}  5) No follow-ups on file.  Jinnie Cookey, CNM  [facility] 03/08/2024, 10:34 AM

## 2024-03-13 ENCOUNTER — Encounter: Payer: Self-pay | Admitting: Licensed Practical Nurse

## 2024-03-16 ENCOUNTER — Encounter: Payer: Self-pay | Admitting: Neurology

## 2024-03-23 ENCOUNTER — Other Ambulatory Visit: Payer: Self-pay | Admitting: Licensed Practical Nurse

## 2024-03-23 DIAGNOSIS — F419 Anxiety disorder, unspecified: Secondary | ICD-10-CM

## 2024-03-27 NOTE — Telephone Encounter (Signed)
 Pharmacy requests 90 day supply to be dispensed rather than 30 days.

## 2024-03-31 ENCOUNTER — Ambulatory Visit: Admitting: Licensed Practical Nurse

## 2024-04-01 ENCOUNTER — Other Ambulatory Visit: Payer: Self-pay | Admitting: Licensed Practical Nurse

## 2024-04-01 DIAGNOSIS — G43711 Chronic migraine without aura, intractable, with status migrainosus: Secondary | ICD-10-CM

## 2024-04-01 DIAGNOSIS — R52 Pain, unspecified: Secondary | ICD-10-CM

## 2024-04-04 ENCOUNTER — Other Ambulatory Visit: Payer: Self-pay | Admitting: Licensed Practical Nurse

## 2024-04-04 DIAGNOSIS — Z5181 Encounter for therapeutic drug level monitoring: Secondary | ICD-10-CM

## 2024-04-04 DIAGNOSIS — N951 Menopausal and female climacteric states: Secondary | ICD-10-CM

## 2024-04-04 MED ORDER — VEOZAH 45 MG PO TABS
1.0000 | ORAL_TABLET | Freq: Every day | ORAL | 11 refills | Status: AC
Start: 2024-04-04 — End: ?

## 2024-04-04 NOTE — Progress Notes (Signed)
 Pt continues to have hot flashes despite estrogen replacement, tried Effexor  but could not tolerate the side effects. Will try Veozah.  LFT's ordered for 1, 2, and 3 months.  Jinnie Cookey, CNM  [facility] 04/04/24  2:37 PM

## 2024-04-06 ENCOUNTER — Telehealth: Payer: Self-pay

## 2024-04-06 NOTE — Telephone Encounter (Signed)
 [name]

## 2024-04-07 ENCOUNTER — Telehealth: Admitting: Physician Assistant

## 2024-04-07 DIAGNOSIS — R3989 Other symptoms and signs involving the genitourinary system: Secondary | ICD-10-CM | POA: Diagnosis not present

## 2024-04-07 MED ORDER — NITROFURANTOIN MONOHYD MACRO 100 MG PO CAPS: 100.0000 mg | ORAL_CAPSULE | Freq: Two times a day (BID) | ORAL | 0 refills | Status: AC

## 2024-04-07 NOTE — Progress Notes (Signed)

## 2024-04-10 ENCOUNTER — Encounter: Payer: Self-pay | Admitting: Licensed Practical Nurse

## 2024-04-13 ENCOUNTER — Other Ambulatory Visit: Payer: Self-pay | Admitting: Licensed Practical Nurse

## 2024-04-13 DIAGNOSIS — N951 Menopausal and female climacteric states: Secondary | ICD-10-CM

## 2024-04-13 MED ORDER — ESTRADIOL 0.1 MG/24HR TD PTWK: 0.1000 mg | MEDICATED_PATCH | TRANSDERMAL | 12 refills | Status: AC

## 2024-04-13 NOTE — Progress Notes (Signed)
 Pt denied Veozah, she must fail 2 medications on preferred list, Effexor  is not on list. Will tyry estrogen patch.  Script sent to pharmacy.mychart message sent to pt Jinnie Cookey, CNM  [facility] 04/13/24  9:13 AM

## 2024-04-22 ENCOUNTER — Other Ambulatory Visit: Payer: Self-pay | Admitting: Obstetrics

## 2024-04-22 DIAGNOSIS — E559 Vitamin D deficiency, unspecified: Secondary | ICD-10-CM

## 2024-07-01 ENCOUNTER — Other Ambulatory Visit: Payer: Self-pay | Admitting: Licensed Practical Nurse

## 2024-07-01 ENCOUNTER — Other Ambulatory Visit: Payer: Self-pay | Admitting: Obstetrics

## 2024-07-01 DIAGNOSIS — G43711 Chronic migraine without aura, intractable, with status migrainosus: Secondary | ICD-10-CM

## 2024-07-01 DIAGNOSIS — R52 Pain, unspecified: Secondary | ICD-10-CM

## 2024-07-01 DIAGNOSIS — E559 Vitamin D deficiency, unspecified: Secondary | ICD-10-CM

## 2024-07-03 ENCOUNTER — Encounter: Payer: Self-pay | Admitting: Licensed Practical Nurse

## 2024-07-03 DIAGNOSIS — G43711 Chronic migraine without aura, intractable, with status migrainosus: Secondary | ICD-10-CM

## 2024-07-03 DIAGNOSIS — R52 Pain, unspecified: Secondary | ICD-10-CM

## 2024-07-05 MED ORDER — TOPIRAMATE 100 MG PO TABS: 150.0000 mg | ORAL_TABLET | Freq: Every day | ORAL | 1 refills | Status: AC

## 2024-07-13 NOTE — Progress Notes (Unsigned)
 "  NEUROLOGY CONSULTATION NOTE  Maria Silva MRN: [id number] DOB: 11-21-1985  Referring provider: Jinnie Earnie Cookey, CNM *** Primary care provider: ***  Reason for consult:  headache  Assessment/Plan:   ***   Subjective:  Maria Silva is a 39 year old ***-handed female with COPD, SVT, anxiety, depression and history of kidney stone who presents for headache.  History supplemented by referring provider's note.  Onset:  *** Location:  *** Quality:  *** Intensity:  ***.  Aura:  *** Prodrome:  *** Postdrome:  *** Associated symptoms:  ***.  *** denies associated unilateral numbness or weakness. 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 Otherwise  within normal limits.   Past NSAIDS/analgesics:  *** Past abortive triptans:  *** Past abortive ergotamine:  *** Past muscle relaxants:  Flexeril  Past anti-emetic:  Phenergan , Zofran  Past antihypertensive medications:  *** Past antidepressant/antipsychotic medications:  fluoxetine  Past anticonvulsant medications:  *** Past anti-CGRP:  *** Past vitamins/Herbal/Supplements:  *** Past antihistamines/decongestants:  *** Other past therapies:  ***  Current NSAIDS/analgesics:  *** Current triptans:  none Current ergotamine:  none Current anti-emetic:  Zofran -ODT 4mg  Current muscle relaxants:  none Current Antihypertensive medications:  none Current Antidepressant/antipsychotic medications:  Venlafaxine  XR 37.5mg  daily, Wellbutrin  XL 450mg  daily Current Anticonvulsant medications:  topiramate  150mg  daily Current anti-CGRP:  none Current Vitamins/Herbal/Supplements:  magnesium  Current Antihistamines/Decongestants:  Zyrtec Other therapy:  none Birth control/Hormone:  estradiol  Other  medications:  Veozah    Caffeine:  *** Alcohol:  *** Smoker:  *** Diet:  *** Exercise:  *** Depression:  ***; Anxiety:  *** Other pain:  *** Sleep hygiene:  ***  History of TBI/concussion:  *** Family history of headache:  *** Family history of cerebral aneurysm:  ***      PAST MEDICAL HISTORY: Past Medical History:  Diagnosis Date   Asthma    Benign tumor of cervix    COPD (chronic obstructive pulmonary disease) (HCC)    COVID-19 06/2019   Kidney stone    Migraine    Ovarian tumor    Sternal fracture    SVT (supraventricular tachycardia)     PAST SURGICAL HISTORY: Past Surgical History:  Procedure Laterality Date   ABDOMINAL HYSTERECTOMY     APPENDECTOMY     BLADDER REPAIR     BLADDER REPAIR W/ CESAREAN SECTION     bladder stent     Laparascopic     MINOR HEMORRHOIDECTOMY     TUMOR REMOVAL      MEDICATIONS: Medications Ordered Prior to Encounter[1]  ALLERGIES: Allergies[2]  FAMILY HISTORY: Family History  Adopted: Yes  Problem Relation Age of Onset   Heart disease Maternal Grandfather    Renal Disease Brother    Wilson's disease Son     Objective:  *** General: No acute distress.  Patient appears well-groomed.   Head:  Normocephalic/atraumatic Eyes:  fundi examined but not visualized Neck: supple, no paraspinal tenderness, full range of motion Heart: regular rate and rhythm Neurological Exam: Mental status: alert and oriented to person, place, and time, speech fluent and not dysarthric, language intact. Cranial nerves: CN I: not tested CN II: pupils equal, round and reactive to light, visual fields intact CN III, IV, VI:  full range of motion, no nystagmus, no ptosis CN V: facial sensation intact. CN  VII: upper and lower face symmetric CN VIII: hearing intact CN IX, X: gag intact, uvula midline CN XI: sternocleidomastoid and trapezius muscles intact CN XII: tongue midline Bulk & Tone: normal, no fasciculations. Motor:  muscle strength  5/5 throughout Sensation:  Pinprick and vibratory sensation intact. Deep Tendon Reflexes:  2+ throughout,  toes downgoing.   Finger to nose testing:  Without dysmetria.   Gait:  Normal station and stride.  Romberg negative.    Juliene Dunnings, DO  CC: ***        [1]  Current Outpatient Medications on File Prior to Visit  Medication Sig Dispense Refill   albuterol  (VENTOLIN  HFA) 108 (90 Base) MCG/ACT inhaler Inhale 2 puffs into the lungs every 6 (six) hours as needed for wheezing or shortness of breath. 1 each 4   budesonide -formoterol  (SYMBICORT ) 160-4.5 MCG/ACT inhaler Inhale 2 puffs into the lungs 2 (two) times daily. 1 each 6   buPROPion  (WELLBUTRIN  XL) 150 MG 24 hr tablet TAKE 1 TABLET BY MOUTH EVERY DAY 90 tablet 4   buPROPion  (WELLBUTRIN  XL) 300 MG 24 hr tablet TAKE 1 TABLET BY MOUTH EVERY DAY 90 tablet 4   cetirizine (ZYRTEC) 10 MG tablet Take by mouth.     estradiol  (CLIMARA  - DOSED IN MG/24 HR) 0.1 mg/24hr patch Place 1 patch (0.1 mg total) onto the skin once a week. 4 patch 12   Fezolinetant  (VEOZAH ) 45 MG TABS Take 1 tablet (45 mg total) by mouth daily. 30 tablet 11   fluconazole  (DIFLUCAN ) 150 MG tablet Take 1 tablet PO once. Repeat in 3 days if needed. 2 tablet 0   Magnesium  (MAGNESIUM  COMPLEX) 300 MG TABS Take 1 tablet by mouth daily. 30 tablet 3   nitrofurantoin , macrocrystal-monohydrate, (MACROBID ) 100 MG capsule Take 1 capsule (100 mg total) by mouth 2 (two) times daily. 10 capsule 0   ondansetron  (ZOFRAN -ODT) 4 MG disintegrating tablet Take 1 tablet (4 mg total) by mouth every 8 (eight) hours as needed for nausea or vomiting. 20 tablet 0   progesterone  (PROMETRIUM ) 200 MG capsule Take 1 capsule (200 mg total) by mouth daily. TAKE 1 CAPSULE BY MOUTH EVERY DAY 90 capsule 2   sulfamethoxazole -trimethoprim  (BACTRIM  DS) 800-160 MG tablet Take 1 tablet by mouth 2 (two) times daily. 10 tablet 0   topiramate  (TOPAMAX ) 100 MG tablet Take 1.5 tablets (150 mg total) by mouth daily.  135 tablet 1   venlafaxine  XR (EFFEXOR -XR) 37.5 MG 24 hr capsule TAKE 1 CAPSULE BY MOUTH DAILY WITH BREAKFAST. 90 capsule 1   Vitamin D , Ergocalciferol , (DRISDOL ) 1.25 MG (50000 UNIT) CAPS capsule TAKE 1 CAPSULE BY MOUTH ONE TIME PER WEEK 4 capsule 0   [DISCONTINUED] potassium chloride  (K-DUR) 10 MEQ tablet Take 1 tablet (10 mEq total) by mouth daily. 30 tablet 1   [DISCONTINUED] promethazine  (PHENERGAN ) 25 MG tablet Take 1 tablet (25 mg total) by mouth every 6 (six) hours as needed for nausea or vomiting. 10 tablet 0   No current facility-administered medications on file prior to visit.  [2]  Allergies Allergen Reactions   Amoxicillin Nausea And Vomiting and Rash   Tape Rash   Tapentadol Rash   Vicodin [Hydrocodone-Acetaminophen ] Nausea And Vomiting and Rash   "

## 2024-07-14 ENCOUNTER — Ambulatory Visit: Payer: Self-pay | Admitting: Neurology

## 2024-10-24 ENCOUNTER — Ambulatory Visit: Payer: Self-pay | Admitting: Neurology
